# Patient Record
Sex: Female | Born: 1965 | Race: Black or African American | Hispanic: No | Smoking: Never smoker
Health system: Southern US, Community
[De-identification: ages and names within clinical notes are randomized; demographics above are authoritative.]

## PROBLEM LIST (undated history)

## (undated) DIAGNOSIS — N19 Unspecified kidney failure: Secondary | ICD-10-CM

## (undated) DIAGNOSIS — G629 Polyneuropathy, unspecified: Secondary | ICD-10-CM

## (undated) DIAGNOSIS — Z21 Asymptomatic human immunodeficiency virus [HIV] infection status: Secondary | ICD-10-CM

## (undated) DIAGNOSIS — R011 Cardiac murmur, unspecified: Secondary | ICD-10-CM

## (undated) DIAGNOSIS — Z5189 Encounter for other specified aftercare: Secondary | ICD-10-CM

## (undated) DIAGNOSIS — T7840XA Allergy, unspecified, initial encounter: Secondary | ICD-10-CM

## (undated) DIAGNOSIS — B20 Human immunodeficiency virus [HIV] disease: Secondary | ICD-10-CM

## (undated) DIAGNOSIS — N186 End stage renal disease: Secondary | ICD-10-CM

## (undated) DIAGNOSIS — R739 Hyperglycemia, unspecified: Secondary | ICD-10-CM

## (undated) HISTORY — DX: Asymptomatic human immunodeficiency virus (hiv) infection status: Z21

## (undated) HISTORY — DX: Cardiac murmur, unspecified: R01.1

## (undated) HISTORY — DX: End stage renal disease: N18.6

## (undated) HISTORY — DX: Hyperglycemia, unspecified: R73.9

## (undated) HISTORY — DX: Unspecified kidney failure: N19

## (undated) HISTORY — PX: BREAST REDUCTION SURGERY: SHX8

## (undated) HISTORY — PX: REDUCTION MAMMAPLASTY: SUR839

## (undated) HISTORY — DX: Polyneuropathy, unspecified: G62.9

## (undated) HISTORY — DX: Human immunodeficiency virus (HIV) disease: B20

## (undated) HISTORY — PX: HEMORROIDECTOMY: SUR656

## (undated) HISTORY — DX: Allergy, unspecified, initial encounter: T78.40XA

## (undated) HISTORY — DX: Encounter for other specified aftercare: Z51.89

---

## 1991-10-14 HISTORY — PX: TUBAL LIGATION: SHX77

## 1996-10-27 ENCOUNTER — Encounter: Payer: Self-pay | Admitting: Internal Medicine

## 1997-08-04 ENCOUNTER — Encounter: Payer: Self-pay | Admitting: Internal Medicine

## 1998-11-14 ENCOUNTER — Encounter (INDEPENDENT_AMBULATORY_CARE_PROVIDER_SITE_OTHER): Payer: Self-pay | Admitting: *Deleted

## 1998-11-14 LAB — CONVERTED CEMR LAB: CD4 Count: 266 microliters

## 1999-06-06 ENCOUNTER — Ambulatory Visit (HOSPITAL_COMMUNITY): Admission: RE | Admit: 1999-06-06 | Discharge: 1999-06-06 | Payer: Self-pay | Admitting: Urology

## 1999-06-06 ENCOUNTER — Encounter: Payer: Self-pay | Admitting: Urology

## 1999-06-21 ENCOUNTER — Ambulatory Visit (HOSPITAL_BASED_OUTPATIENT_CLINIC_OR_DEPARTMENT_OTHER): Admission: RE | Admit: 1999-06-21 | Discharge: 1999-06-21 | Payer: Self-pay | Admitting: Urology

## 2000-02-19 ENCOUNTER — Encounter: Admission: RE | Admit: 2000-02-19 | Discharge: 2000-02-19 | Payer: Self-pay | Admitting: Infectious Diseases

## 2000-02-19 ENCOUNTER — Ambulatory Visit (HOSPITAL_COMMUNITY): Admission: RE | Admit: 2000-02-19 | Discharge: 2000-02-19 | Payer: Self-pay | Admitting: Infectious Diseases

## 2000-03-16 ENCOUNTER — Encounter: Admission: RE | Admit: 2000-03-16 | Discharge: 2000-03-16 | Payer: Self-pay | Admitting: Infectious Diseases

## 2000-03-23 ENCOUNTER — Encounter: Admission: RE | Admit: 2000-03-23 | Discharge: 2000-03-23 | Payer: Self-pay | Admitting: Infectious Diseases

## 2000-04-02 ENCOUNTER — Encounter: Admission: RE | Admit: 2000-04-02 | Discharge: 2000-04-02 | Payer: Self-pay | Admitting: Internal Medicine

## 2000-06-10 ENCOUNTER — Ambulatory Visit (HOSPITAL_COMMUNITY): Admission: RE | Admit: 2000-06-10 | Discharge: 2000-06-10 | Payer: Self-pay | Admitting: Infectious Diseases

## 2000-06-10 ENCOUNTER — Encounter: Admission: RE | Admit: 2000-06-10 | Discharge: 2000-06-10 | Payer: Self-pay | Admitting: Infectious Diseases

## 2000-07-06 ENCOUNTER — Encounter: Admission: RE | Admit: 2000-07-06 | Discharge: 2000-07-06 | Payer: Self-pay | Admitting: Infectious Diseases

## 2000-12-07 ENCOUNTER — Encounter: Admission: RE | Admit: 2000-12-07 | Discharge: 2000-12-07 | Payer: Self-pay | Admitting: Internal Medicine

## 2000-12-07 ENCOUNTER — Ambulatory Visit (HOSPITAL_COMMUNITY): Admission: RE | Admit: 2000-12-07 | Discharge: 2000-12-07 | Payer: Self-pay | Admitting: Infectious Diseases

## 2001-01-11 ENCOUNTER — Encounter: Payer: Self-pay | Admitting: Internal Medicine

## 2001-01-11 ENCOUNTER — Other Ambulatory Visit: Admission: RE | Admit: 2001-01-11 | Discharge: 2001-01-11 | Payer: Self-pay | Admitting: Gynecology

## 2001-09-06 ENCOUNTER — Encounter: Admission: RE | Admit: 2001-09-06 | Discharge: 2001-09-06 | Payer: Self-pay | Admitting: Internal Medicine

## 2001-09-06 ENCOUNTER — Ambulatory Visit (HOSPITAL_COMMUNITY): Admission: RE | Admit: 2001-09-06 | Discharge: 2001-09-06 | Payer: Self-pay | Admitting: Infectious Diseases

## 2001-09-20 ENCOUNTER — Encounter: Admission: RE | Admit: 2001-09-20 | Discharge: 2001-09-20 | Payer: Self-pay | Admitting: Infectious Diseases

## 2001-12-14 ENCOUNTER — Encounter: Admission: RE | Admit: 2001-12-14 | Discharge: 2001-12-14 | Payer: Self-pay | Admitting: Infectious Diseases

## 2001-12-14 ENCOUNTER — Ambulatory Visit (HOSPITAL_COMMUNITY): Admission: RE | Admit: 2001-12-14 | Discharge: 2001-12-14 | Payer: Self-pay | Admitting: Infectious Diseases

## 2002-01-03 ENCOUNTER — Encounter: Admission: RE | Admit: 2002-01-03 | Discharge: 2002-01-03 | Payer: Self-pay | Admitting: Infectious Diseases

## 2002-02-11 ENCOUNTER — Encounter: Admission: RE | Admit: 2002-02-11 | Discharge: 2002-02-11 | Payer: Self-pay | Admitting: Infectious Diseases

## 2002-03-18 ENCOUNTER — Encounter: Admission: RE | Admit: 2002-03-18 | Discharge: 2002-03-18 | Payer: Self-pay | Admitting: Infectious Diseases

## 2002-04-11 ENCOUNTER — Encounter: Admission: RE | Admit: 2002-04-11 | Discharge: 2002-04-11 | Payer: Self-pay | Admitting: Infectious Diseases

## 2002-04-11 ENCOUNTER — Ambulatory Visit (HOSPITAL_COMMUNITY): Admission: RE | Admit: 2002-04-11 | Discharge: 2002-04-11 | Payer: Self-pay | Admitting: Infectious Diseases

## 2002-04-25 ENCOUNTER — Encounter: Admission: RE | Admit: 2002-04-25 | Discharge: 2002-04-25 | Payer: Self-pay | Admitting: Infectious Diseases

## 2002-07-14 ENCOUNTER — Ambulatory Visit (HOSPITAL_COMMUNITY): Admission: RE | Admit: 2002-07-14 | Discharge: 2002-07-14 | Payer: Self-pay | Admitting: Infectious Diseases

## 2002-07-14 ENCOUNTER — Encounter: Admission: RE | Admit: 2002-07-14 | Discharge: 2002-07-14 | Payer: Self-pay | Admitting: Infectious Diseases

## 2002-07-28 ENCOUNTER — Encounter: Admission: RE | Admit: 2002-07-28 | Discharge: 2002-07-28 | Payer: Self-pay | Admitting: Infectious Diseases

## 2002-09-26 ENCOUNTER — Ambulatory Visit (HOSPITAL_COMMUNITY): Admission: RE | Admit: 2002-09-26 | Discharge: 2002-09-26 | Payer: Self-pay | Admitting: Infectious Diseases

## 2002-09-26 ENCOUNTER — Encounter: Admission: RE | Admit: 2002-09-26 | Discharge: 2002-09-26 | Payer: Self-pay | Admitting: Infectious Diseases

## 2002-10-17 ENCOUNTER — Encounter: Admission: RE | Admit: 2002-10-17 | Discharge: 2002-10-17 | Payer: Self-pay | Admitting: Infectious Diseases

## 2002-12-01 ENCOUNTER — Encounter: Admission: RE | Admit: 2002-12-01 | Discharge: 2002-12-01 | Payer: Self-pay | Admitting: Infectious Diseases

## 2002-12-28 ENCOUNTER — Encounter: Payer: Self-pay | Admitting: Internal Medicine

## 2002-12-28 ENCOUNTER — Other Ambulatory Visit: Admission: RE | Admit: 2002-12-28 | Discharge: 2002-12-28 | Payer: Self-pay | Admitting: Gynecology

## 2003-02-09 ENCOUNTER — Encounter (INDEPENDENT_AMBULATORY_CARE_PROVIDER_SITE_OTHER): Payer: Self-pay | Admitting: Infectious Diseases

## 2003-02-09 ENCOUNTER — Encounter: Admission: RE | Admit: 2003-02-09 | Discharge: 2003-02-09 | Payer: Self-pay | Admitting: Infectious Diseases

## 2003-02-27 ENCOUNTER — Encounter: Admission: RE | Admit: 2003-02-27 | Discharge: 2003-02-27 | Payer: Self-pay | Admitting: Infectious Diseases

## 2003-07-17 ENCOUNTER — Encounter (INDEPENDENT_AMBULATORY_CARE_PROVIDER_SITE_OTHER): Payer: Self-pay | Admitting: Infectious Diseases

## 2003-07-17 ENCOUNTER — Encounter: Admission: RE | Admit: 2003-07-17 | Discharge: 2003-07-17 | Payer: Self-pay | Admitting: Infectious Diseases

## 2003-07-17 ENCOUNTER — Ambulatory Visit (HOSPITAL_COMMUNITY): Admission: RE | Admit: 2003-07-17 | Discharge: 2003-07-17 | Payer: Self-pay | Admitting: Infectious Diseases

## 2003-08-07 ENCOUNTER — Encounter: Admission: RE | Admit: 2003-08-07 | Discharge: 2003-08-07 | Payer: Self-pay | Admitting: Infectious Diseases

## 2003-10-24 ENCOUNTER — Ambulatory Visit (HOSPITAL_COMMUNITY): Admission: RE | Admit: 2003-10-24 | Discharge: 2003-10-24 | Payer: Self-pay | Admitting: Infectious Diseases

## 2003-10-24 ENCOUNTER — Encounter: Admission: RE | Admit: 2003-10-24 | Discharge: 2003-10-24 | Payer: Self-pay | Admitting: Infectious Diseases

## 2003-11-20 ENCOUNTER — Encounter: Admission: RE | Admit: 2003-11-20 | Discharge: 2003-11-20 | Payer: Self-pay | Admitting: Infectious Diseases

## 2003-12-04 ENCOUNTER — Encounter: Admission: RE | Admit: 2003-12-04 | Discharge: 2003-12-04 | Payer: Self-pay | Admitting: Infectious Diseases

## 2004-01-03 ENCOUNTER — Inpatient Hospital Stay (HOSPITAL_COMMUNITY): Admission: AD | Admit: 2004-01-03 | Discharge: 2004-01-05 | Payer: Self-pay | Admitting: Nephrology

## 2004-01-03 ENCOUNTER — Emergency Department (HOSPITAL_COMMUNITY): Admission: EM | Admit: 2004-01-03 | Discharge: 2004-01-03 | Payer: Self-pay | Admitting: Emergency Medicine

## 2004-03-08 ENCOUNTER — Ambulatory Visit (HOSPITAL_COMMUNITY): Admission: RE | Admit: 2004-03-08 | Discharge: 2004-03-08 | Payer: Self-pay | Admitting: Vascular Surgery

## 2004-04-10 ENCOUNTER — Ambulatory Visit (HOSPITAL_COMMUNITY): Admission: RE | Admit: 2004-04-10 | Discharge: 2004-04-10 | Payer: Self-pay | Admitting: Nephrology

## 2004-06-18 ENCOUNTER — Ambulatory Visit: Payer: Self-pay | Admitting: Infectious Diseases

## 2004-06-18 ENCOUNTER — Ambulatory Visit (HOSPITAL_COMMUNITY): Admission: RE | Admit: 2004-06-18 | Discharge: 2004-06-18 | Payer: Self-pay | Admitting: Infectious Diseases

## 2004-07-01 ENCOUNTER — Ambulatory Visit: Payer: Self-pay | Admitting: Infectious Diseases

## 2004-08-09 ENCOUNTER — Encounter: Admission: RE | Admit: 2004-08-09 | Discharge: 2004-08-09 | Payer: Self-pay | Admitting: Nephrology

## 2004-09-23 ENCOUNTER — Ambulatory Visit: Payer: Self-pay | Admitting: Internal Medicine

## 2004-09-23 ENCOUNTER — Ambulatory Visit (HOSPITAL_COMMUNITY): Admission: RE | Admit: 2004-09-23 | Discharge: 2004-09-23 | Payer: Self-pay | Admitting: Infectious Diseases

## 2004-10-04 ENCOUNTER — Ambulatory Visit: Payer: Self-pay | Admitting: Infectious Diseases

## 2005-01-15 ENCOUNTER — Other Ambulatory Visit: Admission: RE | Admit: 2005-01-15 | Discharge: 2005-01-15 | Payer: Self-pay | Admitting: Gynecology

## 2005-01-15 ENCOUNTER — Encounter: Payer: Self-pay | Admitting: Internal Medicine

## 2005-03-18 ENCOUNTER — Ambulatory Visit (HOSPITAL_COMMUNITY): Admission: RE | Admit: 2005-03-18 | Discharge: 2005-03-18 | Payer: Self-pay | Admitting: Infectious Diseases

## 2005-03-18 ENCOUNTER — Ambulatory Visit: Payer: Self-pay | Admitting: Infectious Diseases

## 2005-04-08 ENCOUNTER — Ambulatory Visit: Payer: Self-pay | Admitting: Infectious Diseases

## 2005-07-23 ENCOUNTER — Ambulatory Visit (HOSPITAL_COMMUNITY): Admission: RE | Admit: 2005-07-23 | Discharge: 2005-07-23 | Payer: Self-pay | Admitting: Infectious Diseases

## 2005-07-23 ENCOUNTER — Ambulatory Visit: Payer: Self-pay | Admitting: Infectious Diseases

## 2005-08-05 ENCOUNTER — Ambulatory Visit: Payer: Self-pay | Admitting: Infectious Diseases

## 2005-11-25 ENCOUNTER — Ambulatory Visit: Payer: Self-pay | Admitting: Infectious Diseases

## 2005-12-11 ENCOUNTER — Ambulatory Visit: Payer: Self-pay | Admitting: Infectious Diseases

## 2005-12-12 ENCOUNTER — Ambulatory Visit: Payer: Self-pay | Admitting: Infectious Diseases

## 2006-02-06 ENCOUNTER — Encounter: Admission: RE | Admit: 2006-02-06 | Discharge: 2006-02-06 | Payer: Self-pay | Admitting: Nephrology

## 2006-02-09 ENCOUNTER — Ambulatory Visit: Payer: Self-pay | Admitting: Infectious Diseases

## 2006-02-09 ENCOUNTER — Encounter: Admission: RE | Admit: 2006-02-09 | Discharge: 2006-02-09 | Payer: Self-pay | Admitting: Infectious Diseases

## 2006-02-20 ENCOUNTER — Encounter: Admission: RE | Admit: 2006-02-20 | Discharge: 2006-02-20 | Payer: Self-pay | Admitting: Nephrology

## 2006-02-25 ENCOUNTER — Ambulatory Visit: Payer: Self-pay | Admitting: Infectious Diseases

## 2006-03-06 ENCOUNTER — Inpatient Hospital Stay (HOSPITAL_COMMUNITY): Admission: AD | Admit: 2006-03-06 | Discharge: 2006-03-10 | Payer: Self-pay | Admitting: Nephrology

## 2006-03-07 ENCOUNTER — Ambulatory Visit: Payer: Self-pay | Admitting: Infectious Diseases

## 2006-05-28 ENCOUNTER — Encounter: Payer: Self-pay | Admitting: Internal Medicine

## 2006-05-28 ENCOUNTER — Other Ambulatory Visit: Admission: RE | Admit: 2006-05-28 | Discharge: 2006-05-28 | Payer: Self-pay | Admitting: Gynecology

## 2006-05-28 ENCOUNTER — Encounter: Payer: Self-pay | Admitting: Infectious Disease

## 2006-05-28 ENCOUNTER — Encounter (INDEPENDENT_AMBULATORY_CARE_PROVIDER_SITE_OTHER): Payer: Self-pay | Admitting: *Deleted

## 2006-06-16 ENCOUNTER — Encounter: Admission: RE | Admit: 2006-06-16 | Discharge: 2006-06-16 | Payer: Self-pay | Admitting: Infectious Diseases

## 2006-06-16 ENCOUNTER — Ambulatory Visit: Payer: Self-pay | Admitting: Infectious Diseases

## 2006-06-16 ENCOUNTER — Encounter (INDEPENDENT_AMBULATORY_CARE_PROVIDER_SITE_OTHER): Payer: Self-pay | Admitting: *Deleted

## 2006-07-01 ENCOUNTER — Ambulatory Visit: Payer: Self-pay | Admitting: Infectious Diseases

## 2006-07-21 DIAGNOSIS — B20 Human immunodeficiency virus [HIV] disease: Secondary | ICD-10-CM | POA: Insufficient documentation

## 2006-07-21 DIAGNOSIS — Z21 Asymptomatic human immunodeficiency virus [HIV] infection status: Secondary | ICD-10-CM | POA: Insufficient documentation

## 2006-07-31 DIAGNOSIS — N19 Unspecified kidney failure: Secondary | ICD-10-CM | POA: Insufficient documentation

## 2006-09-14 ENCOUNTER — Encounter (INDEPENDENT_AMBULATORY_CARE_PROVIDER_SITE_OTHER): Payer: Self-pay | Admitting: *Deleted

## 2006-09-14 ENCOUNTER — Ambulatory Visit: Payer: Self-pay | Admitting: Infectious Diseases

## 2006-09-14 ENCOUNTER — Encounter: Admission: RE | Admit: 2006-09-14 | Discharge: 2006-09-14 | Payer: Self-pay | Admitting: Infectious Diseases

## 2006-09-14 LAB — CONVERTED CEMR LAB
ALT: 10 units/L (ref 0–35)
AST: 14 units/L (ref 0–37)
Albumin: 4.3 g/dL (ref 3.5–5.2)
Basophils Absolute: 0 10*3/uL (ref 0.0–0.1)
Basophils Relative: 1 % (ref 0–1)
Calcium: 8.9 mg/dL (ref 8.4–10.5)
Chloride: 95 meq/L — ABNORMAL LOW (ref 96–112)
HIV 1 RNA Quant: 105 copies/mL
Lymphs Abs: 1.1 10*3/uL (ref 0.8–3.1)
MCHC: 30.9 g/dL — ABNORMAL LOW (ref 33.1–35.4)
MCV: 106.6 fL — ABNORMAL HIGH (ref 78.8–100.0)
Neutrophils Relative %: 65 % (ref 47–77)
Platelets: 175 10*3/uL (ref 152–374)
Potassium: 4.6 meq/L (ref 3.5–5.3)
RDW: 15 % (ref 11.5–15.3)
Sodium: 140 meq/L (ref 135–145)
Total Protein: 8.1 g/dL (ref 6.0–8.3)
WBC: 6.4 10*3/uL (ref 3.7–10.0)

## 2006-09-28 ENCOUNTER — Ambulatory Visit: Payer: Self-pay | Admitting: Infectious Diseases

## 2006-11-16 ENCOUNTER — Ambulatory Visit: Payer: Self-pay | Admitting: Infectious Diseases

## 2006-11-16 ENCOUNTER — Encounter: Admission: RE | Admit: 2006-11-16 | Discharge: 2006-11-16 | Payer: Self-pay | Admitting: Infectious Diseases

## 2006-11-16 ENCOUNTER — Encounter (INDEPENDENT_AMBULATORY_CARE_PROVIDER_SITE_OTHER): Payer: Self-pay | Admitting: *Deleted

## 2006-11-16 LAB — CONVERTED CEMR LAB
AST: 15 units/L (ref 0–37)
Albumin: 4.4 g/dL (ref 3.5–5.2)
Alkaline Phosphatase: 91 units/L (ref 39–117)
BUN: 17 mg/dL (ref 6–23)
Basophils Relative: 1 % (ref 0–1)
CD4 Count: 140 microliters
Eosinophils Absolute: 0.6 10*3/uL (ref 0.0–0.7)
Eosinophils Relative: 10 % — ABNORMAL HIGH (ref 0–5)
HCT: 43.5 % (ref 36.0–46.0)
HDL: 37 mg/dL — ABNORMAL LOW (ref >39)
HIV 1 RNA Quant: 76 copies/mL
LDL Cholesterol: 87 mg/dL (ref 0–99)
MCHC: 30.3 g/dL (ref 30.0–36.0)
MCV: 99.3 fL (ref 78.0–100.0)
Neutrophils Relative %: 59 % (ref 43–77)
Platelets: 159 10*3/uL (ref 150–400)
Potassium: 4.3 meq/L (ref 3.5–5.3)
Sodium: 142 meq/L (ref 135–145)
Total Bilirubin: 0.5 mg/dL (ref 0.3–1.2)
Total Protein: 8.3 g/dL (ref 6.0–8.3)
Triglycerides: 200 mg/dL — ABNORMAL HIGH (ref <150)
VLDL: 40 mg/dL (ref 0–40)

## 2006-12-07 ENCOUNTER — Encounter (INDEPENDENT_AMBULATORY_CARE_PROVIDER_SITE_OTHER): Payer: Self-pay | Admitting: *Deleted

## 2006-12-07 LAB — CONVERTED CEMR LAB: Pap Smear: NORMAL

## 2006-12-20 ENCOUNTER — Encounter (INDEPENDENT_AMBULATORY_CARE_PROVIDER_SITE_OTHER): Payer: Self-pay | Admitting: *Deleted

## 2006-12-20 ENCOUNTER — Encounter: Admission: RE | Admit: 2006-12-20 | Discharge: 2006-12-20 | Payer: Self-pay | Admitting: Nephrology

## 2006-12-28 ENCOUNTER — Ambulatory Visit: Payer: Self-pay | Admitting: Infectious Diseases

## 2006-12-28 DIAGNOSIS — G5790 Unspecified mononeuropathy of unspecified lower limb: Secondary | ICD-10-CM | POA: Insufficient documentation

## 2007-02-18 ENCOUNTER — Ambulatory Visit: Payer: Self-pay | Admitting: Infectious Diseases

## 2007-02-18 ENCOUNTER — Encounter: Admission: RE | Admit: 2007-02-18 | Discharge: 2007-02-18 | Payer: Self-pay | Admitting: Infectious Diseases

## 2007-03-03 ENCOUNTER — Ambulatory Visit: Payer: Self-pay | Admitting: Infectious Diseases

## 2007-04-22 ENCOUNTER — Encounter (INDEPENDENT_AMBULATORY_CARE_PROVIDER_SITE_OTHER): Payer: Self-pay | Admitting: *Deleted

## 2007-04-23 ENCOUNTER — Ambulatory Visit: Payer: Self-pay | Admitting: Internal Medicine

## 2007-04-30 ENCOUNTER — Telehealth: Payer: Self-pay | Admitting: Internal Medicine

## 2007-05-04 ENCOUNTER — Encounter (INDEPENDENT_AMBULATORY_CARE_PROVIDER_SITE_OTHER): Payer: Self-pay | Admitting: *Deleted

## 2007-05-10 ENCOUNTER — Encounter: Payer: Self-pay | Admitting: Infectious Disease

## 2007-05-12 ENCOUNTER — Telehealth: Payer: Self-pay | Admitting: Infectious Disease

## 2007-06-23 ENCOUNTER — Telehealth: Payer: Self-pay | Admitting: Internal Medicine

## 2007-08-24 ENCOUNTER — Encounter (INDEPENDENT_AMBULATORY_CARE_PROVIDER_SITE_OTHER): Payer: Self-pay | Admitting: *Deleted

## 2007-08-24 ENCOUNTER — Ambulatory Visit: Payer: Self-pay | Admitting: Internal Medicine

## 2007-08-24 ENCOUNTER — Encounter: Admission: RE | Admit: 2007-08-24 | Discharge: 2007-08-24 | Payer: Self-pay | Admitting: Internal Medicine

## 2007-08-24 ENCOUNTER — Other Ambulatory Visit: Admission: RE | Admit: 2007-08-24 | Discharge: 2007-08-24 | Payer: Self-pay | Admitting: Gynecology

## 2007-08-24 LAB — CONVERTED CEMR LAB: Pap Smear: NORMAL

## 2007-09-07 ENCOUNTER — Ambulatory Visit: Payer: Self-pay | Admitting: Internal Medicine

## 2007-11-09 ENCOUNTER — Encounter: Payer: Self-pay | Admitting: Internal Medicine

## 2007-11-17 ENCOUNTER — Encounter: Admission: RE | Admit: 2007-11-17 | Discharge: 2007-11-17 | Payer: Self-pay | Admitting: Internal Medicine

## 2007-11-17 ENCOUNTER — Ambulatory Visit: Payer: Self-pay | Admitting: Internal Medicine

## 2007-11-17 LAB — CONVERTED CEMR LAB
ALT: 15 units/L (ref 0–35)
AST: 20 units/L (ref 0–37)
Basophils Absolute: 0 10*3/uL (ref 0.0–0.1)
Basophils Relative: 1 % (ref 0–1)
Calcium: 10.1 mg/dL (ref 8.4–10.5)
Chloride: 108 meq/L (ref 96–112)
Creatinine, Ser: 1.58 mg/dL — ABNORMAL HIGH (ref 0.40–1.20)
MCHC: 32.4 g/dL (ref 30.0–36.0)
Monocytes Absolute: 0.6 10*3/uL (ref 0.1–1.0)
Neutro Abs: 2.4 10*3/uL (ref 1.7–7.7)
Neutrophils Relative %: 54 % (ref 43–77)
Platelets: 183 10*3/uL (ref 150–400)
Potassium: 4.2 meq/L (ref 3.5–5.3)
RDW: 13.2 % (ref 11.5–15.5)
Sodium: 140 meq/L (ref 135–145)
Total Protein: 7.3 g/dL (ref 6.0–8.3)

## 2007-11-18 ENCOUNTER — Telehealth: Payer: Self-pay | Admitting: Internal Medicine

## 2007-12-07 ENCOUNTER — Encounter (INDEPENDENT_AMBULATORY_CARE_PROVIDER_SITE_OTHER): Payer: Self-pay | Admitting: *Deleted

## 2007-12-08 ENCOUNTER — Ambulatory Visit: Payer: Self-pay | Admitting: Internal Medicine

## 2007-12-08 DIAGNOSIS — Z940 Kidney transplant status: Secondary | ICD-10-CM | POA: Insufficient documentation

## 2007-12-14 ENCOUNTER — Encounter: Payer: Self-pay | Admitting: Internal Medicine

## 2007-12-21 ENCOUNTER — Telehealth: Payer: Self-pay | Admitting: Internal Medicine

## 2008-01-14 ENCOUNTER — Encounter: Payer: Self-pay | Admitting: Internal Medicine

## 2008-03-13 ENCOUNTER — Encounter: Payer: Self-pay | Admitting: Internal Medicine

## 2008-03-14 ENCOUNTER — Encounter: Admission: RE | Admit: 2008-03-14 | Discharge: 2008-03-14 | Payer: Self-pay | Admitting: Internal Medicine

## 2008-03-14 ENCOUNTER — Ambulatory Visit: Payer: Self-pay | Admitting: Internal Medicine

## 2008-03-14 LAB — CONVERTED CEMR LAB
BUN: 19 mg/dL (ref 6–23)
Basophils Relative: 0 % (ref 0–1)
CO2: 19 meq/L (ref 19–32)
Calcium: 10.1 mg/dL (ref 8.4–10.5)
Chloride: 111 meq/L (ref 96–112)
Creatinine, Ser: 1.67 mg/dL — ABNORMAL HIGH (ref 0.40–1.20)
HIV 1 RNA Quant: <50 copies/mL (ref <50)
HIV-1 RNA Quant, Log: <1.7 (ref <1.70)
Hemoglobin: 11.3 g/dL — ABNORMAL LOW (ref 12.0–15.0)
Lymphocytes Relative: 16 % (ref 12–46)
MCHC: 32.5 g/dL (ref 30.0–36.0)
Monocytes Absolute: 0.5 10*3/uL (ref 0.1–1.0)
Monocytes Relative: 11 % (ref 3–12)
Neutro Abs: 3.3 10*3/uL (ref 1.7–7.7)
RBC: 3.91 M/uL (ref 3.87–5.11)

## 2008-03-22 ENCOUNTER — Ambulatory Visit: Payer: Self-pay | Admitting: Internal Medicine

## 2008-03-22 DIAGNOSIS — R7309 Other abnormal glucose: Secondary | ICD-10-CM | POA: Insufficient documentation

## 2008-10-11 ENCOUNTER — Other Ambulatory Visit: Admission: RE | Admit: 2008-10-11 | Discharge: 2008-10-11 | Payer: Self-pay | Admitting: Gynecology

## 2008-10-13 HISTORY — PX: KIDNEY TRANSPLANT: SHX239

## 2009-11-22 ENCOUNTER — Ambulatory Visit: Payer: Self-pay | Admitting: Vascular Surgery

## 2009-12-04 ENCOUNTER — Ambulatory Visit (HOSPITAL_COMMUNITY)
Admission: RE | Admit: 2009-12-04 | Discharge: 2009-12-04 | Payer: Self-pay | Source: Home / Self Care | Admitting: Vascular Surgery

## 2009-12-04 ENCOUNTER — Ambulatory Visit: Payer: Self-pay | Admitting: Vascular Surgery

## 2009-12-04 HISTORY — PX: OTHER SURGICAL HISTORY: SHX169

## 2010-01-17 ENCOUNTER — Ambulatory Visit: Payer: Self-pay | Admitting: Vascular Surgery

## 2010-09-02 ENCOUNTER — Encounter (INDEPENDENT_AMBULATORY_CARE_PROVIDER_SITE_OTHER): Payer: Self-pay | Admitting: *Deleted

## 2010-11-10 LAB — CONVERTED CEMR LAB
ALT: 10 units/L (ref 0–35)
AST: 11 units/L (ref 0–37)
Albumin: 4 g/dL (ref 3.5–5.2)
Albumin: 4.4 g/dL (ref 3.5–5.2)
Alkaline Phosphatase: 74 units/L (ref 39–117)
BUN: 38 mg/dL — ABNORMAL HIGH (ref 6–23)
Basophils Absolute: 0.1 10*3/uL (ref 0.0–0.1)
Basophils Relative: 1 % (ref 0–1)
CO2: 18 meq/L — ABNORMAL LOW (ref 19–32)
Calcium: 9.9 mg/dL (ref 8.4–10.5)
Chloride: 110 meq/L (ref 96–112)
Creatinine, Ser: 10.56 mg/dL — ABNORMAL HIGH (ref 0.40–1.20)
Eosinophils Absolute: 0.6 10*3/uL (ref 0.0–0.7)
Eosinophils Relative: 13 % — ABNORMAL HIGH (ref 0–5)
Glucose, Bld: 110 mg/dL — ABNORMAL HIGH (ref 70–99)
Glucose, Bld: 119 mg/dL — ABNORMAL HIGH (ref 70–99)
HCT: 42.3 % (ref 36.0–46.0)
HIV 1 RNA Quant: 207 copies/mL — ABNORMAL HIGH (ref <50)
HIV 1 RNA Quant: <50 copies/mL (ref <50)
HIV-1 RNA Quant, Log: <1.7 (ref <1.70)
Hemoglobin: 10.5 g/dL — ABNORMAL LOW (ref 12.0–15.0)
Hemoglobin: 12.8 g/dL (ref 12.0–15.0)
Lymphocytes Relative: 20 % (ref 12–46)
Lymphs Abs: 1.6 10*3/uL (ref 0.7–3.3)
Lymphs Abs: 2 10*3/uL (ref 0.7–4.0)
MCHC: 30.3 g/dL (ref 30.0–36.0)
MCV: 90.4 fL (ref 78.0–100.0)
Monocytes Absolute: 0.5 10*3/uL (ref 0.2–0.7)
Monocytes Absolute: 0.8 10*3/uL (ref 0.1–1.0)
Monocytes Relative: 9 % (ref 3–11)
Neutro Abs: 7.3 10*3/uL (ref 1.7–7.7)
Neutrophils Relative %: 43 % (ref 43–77)
Platelets: 203 10*3/uL (ref 150–400)
Potassium: 4.5 meq/L (ref 3.5–5.3)
RBC: 4.68 M/uL (ref 3.87–5.11)
RDW: 17.9 % — ABNORMAL HIGH (ref 11.5–15.5)
Sodium: 140 meq/L (ref 135–145)
Total Protein: 7.1 g/dL (ref 6.0–8.3)
WBC: 10.3 10*3/uL (ref 4.0–10.5)
WBC: 4.8 10*3/uL (ref 4.0–10.5)

## 2010-11-12 ENCOUNTER — Encounter (HOSPITAL_COMMUNITY)
Admission: RE | Admit: 2010-11-12 | Discharge: 2010-11-12 | Payer: Self-pay | Source: Home / Self Care | Attending: Nephrology | Admitting: Nephrology

## 2010-11-12 LAB — POCT HEMOGLOBIN-HEMACUE: Hemoglobin: 12.5 g/dL (ref 12.0–15.0)

## 2010-11-14 NOTE — Miscellaneous (Signed)
  Clinical Lists Changes  Observations: Added new observation of YEARAIDSPOS: 1998  (09/02/2010 14:33) Added new observation of HIV STATUS: CDC-defined AIDS  (09/02/2010 14:33)

## 2010-11-26 ENCOUNTER — Other Ambulatory Visit: Payer: Self-pay

## 2010-11-26 ENCOUNTER — Encounter (HOSPITAL_COMMUNITY): Payer: Private Health Insurance - Indemnity | Attending: Nephrology

## 2010-11-26 DIAGNOSIS — N2581 Secondary hyperparathyroidism of renal origin: Secondary | ICD-10-CM | POA: Insufficient documentation

## 2010-11-26 DIAGNOSIS — D638 Anemia in other chronic diseases classified elsewhere: Secondary | ICD-10-CM | POA: Insufficient documentation

## 2010-11-26 DIAGNOSIS — Z940 Kidney transplant status: Secondary | ICD-10-CM | POA: Insufficient documentation

## 2010-11-26 DIAGNOSIS — N184 Chronic kidney disease, stage 4 (severe): Secondary | ICD-10-CM | POA: Insufficient documentation

## 2010-12-09 ENCOUNTER — Encounter (HOSPITAL_COMMUNITY): Payer: Private Health Insurance - Indemnity

## 2010-12-10 ENCOUNTER — Encounter (HOSPITAL_COMMUNITY): Payer: Private Health Insurance - Indemnity

## 2010-12-16 ENCOUNTER — Encounter (HOSPITAL_COMMUNITY): Payer: Private Health Insurance - Indemnity | Attending: Nephrology

## 2010-12-16 ENCOUNTER — Other Ambulatory Visit: Payer: Self-pay | Admitting: Nephrology

## 2010-12-16 DIAGNOSIS — D638 Anemia in other chronic diseases classified elsewhere: Secondary | ICD-10-CM | POA: Insufficient documentation

## 2010-12-16 DIAGNOSIS — Z940 Kidney transplant status: Secondary | ICD-10-CM | POA: Insufficient documentation

## 2010-12-16 DIAGNOSIS — N184 Chronic kidney disease, stage 4 (severe): Secondary | ICD-10-CM | POA: Insufficient documentation

## 2010-12-16 DIAGNOSIS — N2581 Secondary hyperparathyroidism of renal origin: Secondary | ICD-10-CM | POA: Insufficient documentation

## 2010-12-16 DIAGNOSIS — E785 Hyperlipidemia, unspecified: Secondary | ICD-10-CM | POA: Insufficient documentation

## 2010-12-16 LAB — CBC
MCH: 32.3 pg (ref 26.0–34.0)
Platelets: 148 10*3/uL — ABNORMAL LOW (ref 150–400)
RBC: 3.71 MIL/uL — ABNORMAL LOW (ref 3.87–5.11)
RDW: 15 % (ref 11.5–15.5)

## 2010-12-16 LAB — PROTEIN / CREATININE RATIO, URINE
Creatinine, Urine: 142.6 mg/dL
Protein Creatinine Ratio: 0.14 (ref 0.00–0.15)
Total Protein, Urine: 20 mg/dL

## 2010-12-16 LAB — URINALYSIS, ROUTINE W REFLEX MICROSCOPIC
Ketones, ur: NEGATIVE mg/dL
Nitrite: NEGATIVE
Protein, ur: NEGATIVE mg/dL
Urobilinogen, UA: 0.2 mg/dL (ref 0.0–1.0)
pH: 6 (ref 5.0–8.0)

## 2010-12-16 LAB — ELECTROLYTE PANEL: Chloride: 113 mEq/L — ABNORMAL HIGH (ref 96–112)

## 2010-12-20 LAB — MISCELLANEOUS TEST

## 2010-12-24 LAB — MISCELLANEOUS TEST

## 2010-12-30 ENCOUNTER — Encounter (HOSPITAL_COMMUNITY): Payer: Private Health Insurance - Indemnity

## 2011-01-01 LAB — POCT I-STAT 4, (NA,K, GLUC, HGB,HCT)
Potassium: 4.4 mEq/L (ref 3.5–5.1)
Sodium: 142 mEq/L (ref 135–145)

## 2011-01-06 ENCOUNTER — Other Ambulatory Visit: Payer: Self-pay | Admitting: Nephrology

## 2011-01-06 ENCOUNTER — Encounter (HOSPITAL_COMMUNITY): Payer: Private Health Insurance - Indemnity

## 2011-01-06 LAB — COMPREHENSIVE METABOLIC PANEL
AST: 15 U/L (ref 0–37)
Alkaline Phosphatase: 97 U/L (ref 39–117)
BUN: 55 mg/dL — ABNORMAL HIGH (ref 6–23)
CO2: 13 mEq/L — ABNORMAL LOW (ref 19–32)
Chloride: 114 mEq/L — ABNORMAL HIGH (ref 96–112)
Creatinine, Ser: 4.76 mg/dL — ABNORMAL HIGH (ref 0.4–1.2)
GFR calc Af Amer: 12 mL/min — ABNORMAL LOW (ref >60)
GFR calc non Af Amer: 10 mL/min — ABNORMAL LOW (ref >60)
Total Bilirubin: 0.4 mg/dL (ref 0.3–1.2)

## 2011-01-06 LAB — IRON AND TIBC: TIBC: 234 ug/dL — ABNORMAL LOW (ref 250–470)

## 2011-01-20 ENCOUNTER — Encounter (HOSPITAL_COMMUNITY): Payer: Private Health Insurance - Indemnity

## 2011-02-21 ENCOUNTER — Other Ambulatory Visit: Payer: Self-pay | Admitting: Nephrology

## 2011-02-21 ENCOUNTER — Encounter (HOSPITAL_COMMUNITY): Payer: Private Health Insurance - Indemnity | Attending: Nephrology

## 2011-02-21 DIAGNOSIS — D638 Anemia in other chronic diseases classified elsewhere: Secondary | ICD-10-CM | POA: Insufficient documentation

## 2011-02-21 DIAGNOSIS — E785 Hyperlipidemia, unspecified: Secondary | ICD-10-CM | POA: Insufficient documentation

## 2011-02-21 DIAGNOSIS — N2581 Secondary hyperparathyroidism of renal origin: Secondary | ICD-10-CM | POA: Insufficient documentation

## 2011-02-21 DIAGNOSIS — Z940 Kidney transplant status: Secondary | ICD-10-CM | POA: Insufficient documentation

## 2011-02-21 DIAGNOSIS — N184 Chronic kidney disease, stage 4 (severe): Secondary | ICD-10-CM | POA: Insufficient documentation

## 2011-02-21 LAB — COMPREHENSIVE METABOLIC PANEL
ALT: 11 U/L (ref 0–35)
AST: 16 U/L (ref 0–37)
Calcium: 9.9 mg/dL (ref 8.4–10.5)
GFR calc Af Amer: 13 mL/min — ABNORMAL LOW (ref >60)
Potassium: 3.9 mEq/L (ref 3.5–5.1)
Sodium: 141 mEq/L (ref 135–145)
Total Protein: 7.5 g/dL (ref 6.0–8.3)

## 2011-02-21 LAB — URINALYSIS, ROUTINE W REFLEX MICROSCOPIC
Bilirubin Urine: NEGATIVE
Glucose, UA: NEGATIVE mg/dL
Ketones, ur: NEGATIVE mg/dL
Protein, ur: NEGATIVE mg/dL
pH: 6 (ref 5.0–8.0)

## 2011-02-21 LAB — PROTEIN / CREATININE RATIO, URINE: Protein Creatinine Ratio: 0.1 (ref 0.00–0.15)

## 2011-02-21 LAB — CBC
Hemoglobin: 10.2 g/dL — ABNORMAL LOW (ref 12.0–15.0)
MCHC: 32.3 g/dL (ref 30.0–36.0)
RDW: 14.9 % (ref 11.5–15.5)
WBC: 8.3 10*3/uL (ref 4.0–10.5)

## 2011-02-21 LAB — IRON AND TIBC
Iron: 87 ug/dL (ref 42–135)
Saturation Ratios: 35 % (ref 20–55)
TIBC: 247 ug/dL — ABNORMAL LOW (ref 250–470)
UIBC: 160 ug/dL

## 2011-02-25 LAB — TACROLIMUS LEVEL: Tacrolimus (FK506) - LabCorp: 4.6 ng/mL

## 2011-02-25 LAB — MISCELLANEOUS TEST

## 2011-02-25 NOTE — Procedures (Signed)
CEPHALIC VEIN MAPPING   INDICATION:  Preop evaluation for AV fistula placement.   HISTORY:  End-stage renal disease, history of left upper arm AVG.   EXAM:  The right cephalic vein is compressible with diameter measurements  ranging from 0.13 to 0.35 cm.   The right basilic vein is compressible with diameter measurements  ranging from 0.26 to 0.41 cm.   The left cephalic vein is compressible with diameter measurements  ranging from 0.21 to 0.29 cm.   The left basilic vein is compressible with diameter measurements ranging  from 0.36 to 0.37 cm.   See attached worksheet for all measurements.   IMPRESSION:  1. Patent bilateral cephalic and basilic veins with diameter      measurements as described above and on the attached worksheet.  2. Totally occluded left upper arm arteriovenous graft noted.   ___________________________________________  Judeth Cornfield. Scot Dock, M.D.   CH/MEDQ  D:  11/23/2009  T:  11/24/2009  Job:  [id number]

## 2011-02-25 NOTE — Assessment & Plan Note (Signed)
OFFICE VISIT   Krista Bean, Krista Bean  DOB:  Aug 04, 1966                                       01/17/2010  [id number]   The date of surgery is 12/04/2009.  The surgery was a right basilic vein  transposition.  The patient returns to clinic today in followup for her  basilic vein transposition which was done in late February.  She states  that she has been doing very well.  Her arm is sometimes a little sore.  Her blood pressure and hypercholesterolemia are under good control.   PHYSICAL FINDINGS:  General:  Revealed a well-nourished woman who  appears her stated age, in no distress.  Vital signs:  Heart rate was  67, blood pressure 140/78, O2 saturation was 100%.  HEENT:  PERRLA,  EOMI.  Lungs:  Were clear.  Cardiac:  Revealed a regular rate and  rhythm.  Abdomen:  Soft, nontender.  Her basilic transposition was  evaluated.  The wounds were healing very, very nicely.  She has a very  good thrill in her basilic transposition.  The fistula by my exam  appears to be working well and should be ready for use should she need  it.  We do recommend not using the fistula until 12 weeks postop.  We  will leave final determination of maturation to Dr. Lorrene Reid.  We will  follow her on a p.r.n. basis.   Chad Cordial, PA   Judeth Cornfield. Scot Dock, M.D.  Electronically Signed   KEL/MEDQ  D:  01/17/2010  T:  01/17/2010  Job:  [id number]   cc:   Elzie Rings. Lorrene Reid, M.D.

## 2011-02-25 NOTE — Assessment & Plan Note (Signed)
OFFICE VISIT   ALTON, FALLETTA  DOB:  [birth year]                                       11/22/2009  [id number]   I saw the patient in the office today for evaluation for hemodialysis  access.  This is a pleasant 45 year old woman who underwent a renal  transplant in 2008, but recently her right kidney function has been  declining and we were asked to evaluate her for access.  Her most recent  attempt at access was a left upper arm graft.  At that time this was in  May of 2005.  At that time she was noted to have a very small artery at  the antecubital level with high bifurcation of the brachial artery.  Therefore upper arm loop graft was placed.   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  She has had no recent  uremic symptoms.  She has had no nausea, vomiting or significant  shortness of breath.   PAST MEDICAL HISTORY:  Is otherwise significant for hypertension which  has been stable on her current medications.  She also has a history of  secondary hyperparathyroidism, which also has been stable.   Past medical history is otherwise fairly unremarkable.  She denies any  history of diabetes, history of previous myocardial infarction, history  of congestive heart failure or history of COPD.   FAMILY HISTORY:  No history of premature cardiovascular disease.   SOCIAL HISTORY:  She is married.  She has two children.  She does not  use tobacco.   REVIEW OF SYSTEMS:  CARDIOVASCULAR:  She has had no recent chest pain,  chest pressure, palpitations or arrhythmias.  She does admit to some  dyspnea on exertion.  She has had no claudication, rest pain or  nonhealing ulcers.  She has had no history of stroke, TIAs.  She has had  no history of DVT.  PULMONARY:  She has had no productive cough, bronchitis, asthma or  wheezing.   PHYSICAL EXAMINATION:  This is a pleasant 45 year old woman who appears  stated age.  Blood pressure is 111/72, heart rate  is 65, temperature is  97.6.   I did independently interpret her vein mapping today which shows that  her upper arm cephalic vein on the left is fairly small and this was  explored at the time of her previous surgery and it was noted to be  sclerotic.  The basilic vein on the left empties into the deep system in  the mid upper arm.  Therefore she is not a candidate for basilic vein  transposition on the left.  In addition, she had a very small artery on  the left which would not be good at supporting the fistula.  On the  right side her forearm and upper arm cephalic vein is very small and she  does not appear to be a candidate for a radiocephalic or brachial  cephalic fistula.  The basilic vein on the left is moderate size and she  may potentially be a candidate for a basilic vein transposition on the  right side.   I have recommended we explore the basilic vein on the right side and if  this is adequate do a basilic vein transposition on the right.  If this  is not adequate we would place a right arm AV graft.  She is somewhat  hesitant to proceed with surgery, so she did not want to schedule this  until December 04, 2009.  We have reviewed the procedure and potential  complications in the office today.  She is agreeable to proceed at that  time.     Judeth Cornfield. Scot Dock, M.D.  Electronically Signed   CSD/MEDQ  D:  11/22/2009  T:  11/23/2009  Job:  [id number]   cc:   Caren Griffins B. Lorrene Reid, M.D.

## 2011-02-28 NOTE — Discharge Summary (Signed)
Krista Bean, Krista Bean                 ACCOUNT NO.:  [id number]   MEDICAL RECORD NO.:  [id number]           PATIENT TYPE:   LOCATION:                               FACILITY:  [facility]   PHYSICIAN:  Roney Jaffe, M.D.   DATE OF BIRTH:  03-13-1966   DATE OF ADMISSION:  03/06/2006  DATE OF DISCHARGE:  03/10/2005                                 DISCHARGE SUMMARY   DISCHARGE DIAGNOSES:  1.  Pancytopenia.  2.  Advanced human immunodeficiency virus disease.  3.  Malnutrition.  4.  Nonadherence to HIV medications.  5.  Depression.   CONSULTATIONS:  Dr. Lars Mage.   HISTORY OF PRESENT ILLNESS:  Krista Bean is a 45 year old African-American  female with HIV of seven years' duration who is a dialysis patient at the  [facility].  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  She has variable adherence to her  HIV medications.  She said in February she stopped taking them due to  depression.  She denied taking them through March but in April restarted  them again at the urging of her physicians.  On April 30 her CD-4 count was  less than 10.  She states she has been taking her medications again over the  past maybe six weeks or so but has had progressive weakness, poor oral  intake, watery diarrhea, 5-6 small to medium sized stools per day, and has  not gotten better.  She has had progressive anemia noted at dialysis down to  6.5 the day of admission.  She has also had leukopenia with her white count  in the 1.1 range and her phosphorus has been also low, most recently 1.1.  She started Questran without release of diarrhea but after taking Paregoric  yesterday ordered by Dr. Quentin Cornwall she has had no further diarrhea.  The  remainder of the history and physical and physical exam is outlined in the  admission history and physical.   HOSPITAL COURSE:  1.  PANCYTOPENIA.  A.  Neutropenia.  The patient was placed on neutropenic      precautions,  white cell redrawn.  Labs redrawn on May 26 showed total      white count of 800.  These were repeated several times thereafter and at      the time of discharge white count was 1000 with an ANC of 0.  Dr. Orene Desanctis      expressed that this form of neutropenia was different to that induced by      chemotherapy and the patient could be safely discharged home with a low      white count.  He expected the white count to correct if she continued to      take HIV medications as ordered.  B.  Anemia.  Hemoglobin was actually      5.8 on admission.  She received transfusion of one unit of packed red      cells with an increase in hemoglobin to 7.1.  She was then transfused  two units on dialysis on May 27 which brought her hemoglobin up to 10.4.      Her Hemoccult was checked x1 and found to be negative.  This was      probably a combination of HIV and end-stage renal disease.  Her Epogen      dose at the time of admission was 10,000 units.  She has adequate iron      stores and she will continue on Epogen 10,000 units at the time of      discharge.  C.  Pancytopenia.  Platelets were 173,000 on admission.      They had dropped to 126, then 134, and back down to 106,000 at the time      of discharge.  The patient is to have a repeat CBC done Monday, June 4,      to follow up on all these indices.  Additionally she will be on tight      heparin at dialysis.   1.  ADVANCED HIV DISEASE.  The patient received a consult by Dr. Orene Desanctis and      was also seen by Dr. Megan Salon during her hospitalization.  She does have      adequate supply of medications at home.  Her aunt was also recently      diagnosed with HIV.  I also discussed these medications at length with      her and that if she had any problems procuring them that she could ask      the social worker at the dialysis center for assistance.  Apparently she      has been purchasing some out of pocket at [employer] and we suggested that      [place] or [facility] might provide a less costly option.  She will continue      on the following HIV medications:  Viread weekly, Emtriva every fourth      day, Kaletra twice a day, Zithromax once a week for MAC prophylaxis, and      Dapsone for PCP prophylaxis.  Fortunately to this point she has not had      any opportunistic affections.  I have discussed with her how she might      use a calendar to map out how to take the Emtriva every fourth day      because she will not be taking it the same time every week which can be      confusing.  She seemed to understand this process.  She will follow up      with Dr. Quentin Cornwall in June and she had her CD-4 count and viral load      drawn yesterday.  The results are pending at the time of this dictation.   1.  DIARRHEA.  We had planned to check a variety of stool cultures and      studies during this hospitalization; however, with the Paregoric the      diarrhea ceased and it was likely related to HIV medications in some      fashion.  It is not a problem at this time; therefore, studies were not      done.   1.  NONADHERENCE TO HIV MEDICATIONS.  As discussed in #2.   1.  DEPRESSION.  This is understandable.  Dr. Orene Desanctis talked to the patient at      length regarding this.  She is not on any medication at this time.   1.  MALNUTRITION.  The patient has combination of very low phosphorus and a      15% weight loss.  She received dietary consult.  The patient has been      taking Nutra-Phos in the outpatient setting.  This is very costly and      she may have some lactose intolerance because she does have bloating if      she eats a lot of dairy products.  Nonetheless she will try to eat some      dairy products daily and her Nutra-Phos has been established at one      packet twice a day.  We will recheck calcium and phosphorus this Friday      at dialysis.  She had her phosphorus repleted during her hospitalization     and it was within normal limits at 3.8 at  the time of discharge.   LABORATORY DATA:  At the time of discharge:  White count 1 with ANC of 0,  hemoglobin 10.7. platelets 107,000.  Folic acid 3.5.  This came back after  the patient was discharged.  The dialysis center has been notified so she  will have supplemental folate added as part of her vitamin regime.  The 3.5  is in the low indeterminate range.  B12 is within normal limits at 293.  Sodium was 137 potassium 4.7, chloride 98, CO2 27, glucose 80, BUN 24,  creatinine 6.7, albumin 3.3, calcium 9.6, and phosphorus 3.8.  She also had  a chest x-ray on admission which showed no acute disease and she had a new  placement of a left upper arm graft on May 27 by Dr. Scot Dock.   DIET:  Renal diet.  Limit fluids to 1200 cc per day.  The patient may eat  dairy products as tolerated.   ACTIVITY:  Should increase slowly.   DISCHARGE MEDICATIONS:  1.  Nephro-Vite one tablet per day.  2.  Kaletra two tablets two times a day.  3.  Dapsone 100 mg Monday, Wednesday, Friday only.  4.  Viread 300 mg on Thursdays only.  5.  Emtriva 200 mg every fourth day.  6.  Zithromax 1200 mg every Sunday.  Prescription was given for this.  7.  Paregoric 1-2 teaspoons every six hours as needed.  8.  Benadryl 25 mg every eight hours as needed for itching.  9.  Nutra-Phos one packet twice a day.      Alric Seton, P.A.    ______________________________  Roney Jaffe, M.D.    MB/MEDQ  D:  03/10/2006  T:  03/10/2006  Job:  [id number]   cc:   Arelia Longest. Michelle Nasuti., M.D.  Fax: [phone]   [facility]   Sol Blazing, M.D.  Fax: [phone]

## 2011-02-28 NOTE — H&P (Signed)
NAME:  Krista Bean, Krista Bean NO.:  [id number]   MEDICAL RECORD NO.:  [id number]                   PATIENT TYPE:  EMS   LOCATION:  ED                                   FACILITY:  [facility]   PHYSICIAN:  Maudie Flakes. Hassell Done, M.D.                DATE OF BIRTH:  Aug 20, 1966   DATE OF ADMISSION:  01/03/2004  DATE OF DISCHARGE:                                HISTORY & PHYSICAL   Krista Bean is a 45 year old, black woman with a five-year history of HIV  (sees Krista Bean in clinic).  She came to [facility] ER today with a one-  week history of edema.  Blood pressure was 192/135, BUN 48, creatinine 11.8.  Renal ultrasound reportedly showed echo-dense kidneys.  She is admitted to  start dialysis and investigate the etiology of renal failure.  No  measurements of renal function can be found using E-chart back to 2001.   PAST MEDICAL HISTORY:  1. HIV since 1998.  Take Kaletra three tabs b.i.d. and blue pill once a     day.  2. She had breast reduction surgery in 1993.   REVIEW OF SYSTEMS:  Edema.  No hematuria.  No rash, no hematochezia.  No  melena, no arthritis, no loss of consciousness.  No seizures, no angina, no  claudication.   SOCIAL HISTORY:  She was born in [place], [place].  She graduated from  high school.  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  She does  not smoke cigarettes.  She drinks a wine cooler once or twice a month.  She  is married.  Her husband is HIV negative.  She is not sure how she got HIV.  She does not use IV drugs.   FAMILY HISTORY:  Mother, age 21, well.  Father, age 83, diabetic.  Four  sisters, one brother all well.  Two daughters are well.   PHYSICAL EXAMINATION:  GENERAL:  She is awake, alert, courteous.  VITAL SIGNS:  Temperature 98.4; pulse 112; respirations 20; blood pressure  148/100.  MOUTH:  Hyperpigmented areas on tongue.  No Candida.  No oral ulcers.  NECK:  Not stiff.  CHEST:  Clear.  HEART:  No rub.  ABDOMEN:   Nontender.  EXTREMITIES:  2+ pretibial edema.  NEURO:  No flap. Right-handed.  Strength, equal sensation intact.   Sodium 139, potassium 3.9, chloride 116, CO2 17, BUN 48, creatinine 11.8,  calcium 8.0, CK 141, hemoglobin 11.4, WBC 4,700, PLTS 210K.  CD4 count 90.   IMPRESSION/PLAN:  1. Renal failure.  If ultrasound report is true, then probably has HIV     nephropathy.  Will need to check urinalysis, save left arm for vascular     access, and arrange outpatient dialysis (she lives near [facility],     will probably     go to [facility] dialysis.  2. Hypertension.  Treat with labetalol p.o.  3. Human immunodeficiency virus.  Will ask Krista Bean to see.                                                Richard F. Hassell Done, M.D.    RFF/MEDQ  D:  01/03/2004  T:  01/04/2004  Job:  [id number]

## 2011-02-28 NOTE — Discharge Summary (Signed)
NAMEMCKYNLEE, KALEMBA                 ACCOUNT NO.:  [id number]   MEDICAL RECORD NO.:  [id number]          PATIENT TYPE:  INP   LOCATION:  [id number]                         FACILITY:  [facility]   PHYSICIAN:  Sherril Croon, M.D.   DATE OF BIRTH:  12-Aug-1966   DATE OF ADMISSION:  03/06/2006  DATE OF DISCHARGE:  03/10/2006                                 DISCHARGE SUMMARY   ADDENDUM:  The patient did not have an AV Gore-Tex graft placed by Dr.  Scot Dock on Mar 08, 2006.      Alric Seton, P.A.      Sherril Croon, M.D.  Electronically Signed    MB/MEDQ  D:  03/10/2006  T:  03/10/2006  Job:  [id number]

## 2011-02-28 NOTE — Discharge Summary (Signed)
NAME:  Krista Bean, DUSEL NO.:  [id number]   MEDICAL RECORD NO.:  [id number]                   PATIENT TYPE:  INP   LOCATION:  [id number]                                 FACILITY:  [facility]   PHYSICIAN:  Sherril Croon, M.D.                DATE OF BIRTH:  08/21/1966   DATE OF ADMISSION:  01/03/2004  DATE OF DISCHARGE:  01/05/2004                                 DISCHARGE SUMMARY   ADMISSION DIAGNOSES:  1. Renal failure.  2. Hypertension.  3. Human immunodeficiency virus.   DISCHARGE DIAGNOSES:  1. 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  2. Secondary hyperparathyroidism.  3. Hypertension.  4. Iron-deficiency anemia.  5. Human immunodeficiency virus.  6. Malnutrition.   BRIEF HISTORY:  A 45 year old black female with a five-year history of HIV  followed by Dr. Johnnye Sima in the outpatient clinic, presents to [facility]  Emergency Room with a one-week history of edema.  Her blood pressure was  found to be 192/135, BUN 48, creatinine 11.8.  Her renal ultrasound  reportedly showed echodense kidneys. She was admitted to start dialysis and  investigate the etiology of her renal failure.  No measurements of renal  function could be found using the E chart dating back to 2001.   ADMISSION LABORATORY AND X-RAY DATA:  Sodium 139, potassium 3.9, chloride  116, CO2 17, BUN 48, creatinine 11.8, calcium 8.0, creatinine kinase 141.  Hemoglobin 11.4, white count 4700, platelets 201,000,  CD4 count 90.   HOSPITAL COURSE:  The patient was admitted and underwent a dialysis catheter  placement in radiology by Dr. Kathlene Cote.  She tolerated it well and  thereafter underwent her first dialysis treatment on 24 March.  Dr. Johnnye Sima  saw the patient and started her on azithromycin at 1.2 g weekly along with  Dapsone 10 mg daily.  Her left arm was saved in anticipation of requiring a  permanent access.  Dietary and  dialysis education were provided.  Transferrin saturation returned low at 28%.  Intact parathyroid hormone was  elevated at 686.  A 24-hour urine for protein collection resulted in 9.4 g  protein.  Calcitriol therapy was started for secondary hyperparathyroidism.  InFeD and EPO therapy were started for iron-deficiency anemia .   Outpatient dialysis arrangements were made at the [facility] every Tuesday, Thursday, and Saturday.  Outpatient followup  appointment with [facility] will be made for permanent access placement in her  left arm.   She remained afebrile and hemodynamically stable.  She dialyzed a total of  two treatments in the hospital with the lowest post weight of 79.9 kg and  blood pressure being maintained above 150/90.  We will continue to take her  weight down for better blood pressure control.   DISCHARGE MEDICATIONS:  1. Nephro-Vite vitamin 1 daily.  2. Calcium  carbonate 500 mg 2 with meals.  3. Zithromax 600 mg 2 every Thursday after dialysis.  4. Dapsone 100 mg 1 every evening.  5. EPO 10,000 units IV in dialysis.  6. InFeD 100 mg IV each dialysis x 9, then weekly.  7. Calcitriol 1.0 mcg IV each dialysis x 3, then to be increased to 1.5 mcg     IV each dialysis.   Dialysis will be every Tuesday, Thursday, and Saturday at the [facility].  Dry weight to be determined.      Nonah Mattes, P.A.                      Sherril Croon, M.D.    RRK/MEDQ  D:  02/11/2004  T:  02/12/2004  Job:  [id number]   cc:   [facility]

## 2011-02-28 NOTE — Op Note (Signed)
NAME:  Krista Bean, PELLICANE NO.:  [id number]   MEDICAL RECORD NO.:  [id number]                   PATIENT TYPE:  OIB   LOCATION:  [id number]                                 FACILITY:  [facility]   PHYSICIAN:  Judeth Cornfield. Scot Dock, M.D.        DATE OF BIRTH:  1966/05/14   DATE OF PROCEDURE:  03/08/2004  DATE OF DISCHARGE:  03/08/2004                                 OPERATIVE REPORT   PREOPERATIVE DIAGNOSIS:  Chronic renal failure.   POSTOPERATIVE DIAGNOSIS:  Chronic renal failure.   PROCEDURE:  Placement of new left upper arm arteriovenous graft.   SURGEON:  Judeth Cornfield. Scot Dock, M.D.   ASSISTANT:  Hoyle Sauer A. Dub Mikes, P.A.-C.   ANESTHESIA:  Local with sedation.   SURGICAL TECHNIQUE:  The patient was taken to the operating room, sedated by  anesthesia.  The left upper extremity was prepped and draped in the usual  sterile fashion.  After the skin was infiltrated with 1% lidocaine, I made a  transverse incision at the antecubital level.  Here, the cephalic vein was  dissected free.  I ligated it distally and it took a 3.5 mm dilator but  would not take a 4 mm dilator and I did not think it was adequate size for a  fistula.  It did not dilate very well.  This vein was then ligated.  The  artery at this level was quite small and upon interrogation with the Doppler  it appeared there was a radial and ulnar branch at the antecubital level.  For this reason, I felt that the patient likely had a high bifurcation of  the brachial artery and that the patient would require an upper arm loop  graft.  A separate longitudinal incision was made beneath the axilla after  the skin was anesthetized and here, the high brachial vein and high brachial  artery were dissected free.  A 4-7 mm graft was then tunneled in a looped  fashion in the upper arm with the arterial aspect of the graft along the  radial aspect of the upper arm.  The patient was then heparinized.  The  brachial  artery was clamped proximally and distally and a longitudinal  arteriotomy was made.  A segment of the 4 mm end of the graft was excised,  the graft slightly spatulated and sewn end-to-side to the brachial artery  using continuous 6-0 Prolene suture.  The graft was then pulled to the  appropriate length for anastomosis to the brachial vein.  The vein was  ligated distally and spatulated.  The graft was cut to the appropriate  length, spatulated, and sewn end-to-end to the vein using continuous 6-0  Prolene suture.  At the completion, there was an excellent thrill in the  graft and a good signal with the Doppler at the wrist.  Hemostasis was  obtained in the wounds.  The wounds were closed with a deep layer of 3-0  Vicryl and the skin was closed with 4-0 Vicryl.  A sterile dressing was  applied.  The patient tolerated the procedure well and was transferred to  the recovery room in satisfactory condition.  All needle and sponge counts  were correct.                                               Judeth Cornfield. Scot Dock, M.D.    CSD/MEDQ  D:  03/08/2004  T:  03/09/2004  Job:  [id number]

## 2011-02-28 NOTE — H&P (Signed)
NAMEEVELENE, KUBAL                 ACCOUNT NO.:  [id number]   MEDICAL RECORD NO.:  [id number]          PATIENT TYPE:  INP   LOCATION:  [id number]                         FACILITY:  [facility]   PHYSICIAN:  Sol Blazing, M.D.DATE OF BIRTH:  17-May-1966   DATE OF ADMISSION:  03/06/2006  DATE OF DISCHARGE:                                HISTORY & PHYSICAL   CHIEF COMPLAINT:  Anemia, weakness, diarrhea, failure to thrive.   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  She has variable adherence to her  HIV meds.  Says in February she stopped taking them due to depression.  She did not take them through March but in April restarted again at the  urging of her physicians.  On April 30th, her CD4 count was less than 10.  She says that she has been taking her meds again over the past maybe six  weeks or so but has had progressive weakness, poor oral intake, watery  diarrhea, 5-6 small-to-medium sized stools a day, which has not gotten  better.  She has had a progressive anemia noted at dialysis, hemoglobin of  10.5 on May 9th, down to 6.5 on last week.  She has leukopenia.  On labs  today, white blood count is 1.1, phosphorus is 1.1.  She was started on  Questran without relief of diarrhea, but after taking paregoric yesterday  ordered by infectious disease, she has had no further diarrhea.   PAST MEDICAL HISTORY:  1.  HIV in 1998.  2.  Breast reduction in 1993.  3.  Hypertension.  4.  Iron-deficiency anemia.  5.  ESRD, as above.  6.  Past history of diabetes.   SOCIAL HISTORY:  Mother is alive.  Father, 78, diabetic.  Four sisters and  one brother.  Lives with her husband.  She has two daughters.   DRUG ALLERGIES:  NKDA.   MEDICATIONS:  1.  Viread 300 mg weekly on Thursdays only.  2.  Kaletra two tablets b.i.d.  3.  Emtriva 200 mg every  4th day.  4.  Dapsone 100 mg on Monday, Wednesday, Friday.  5.  Paregoric 1-2 as needed.   REVIEW OF SYSTEMS:  GENERAL:  Denies fever, chills, sweats.  Denies sore  throat, difficutly swallowing, change in hearing or vision.  GI:  As above.  GU: denies dysuria, hematuria, frequency.  MUSCULOSKELETAL:  denies myalgia,  athralgia, swelling.   PHYSICAL EXAMINATION:  VITAL SIGNS:  Blood pressure 130/57, pulse 80,  respirations 18, temp 99.5.  GENERAL:  Patient is alert and oriented in no distress.  She is calm and  comfortable.  HEENT:  PERRL, EOMI.  Throat is clear.  Neck is supple.  No lymphadenopathy.  CARDIAC:  Regular rate and rhythm.  No murmur, rubs or gallops.  ABDOMEN:  Soft, flat, nontender.  No HSM.   LAB:  Hemoglobin 5.8, white blood count 1.1   IMPRESSION:  1.  Anemia, progressive, probably due to  human immunodeficiency virus      disease, rule out infection.  2.  Diarrhea, multiple possible infectious causes including HIV,      cryptosporidium.  3.  Advanced AIDS, which recent medicaiton noncompliance and low CD4.  4.  ESRD.  5.  Failure to thrive.  6.  Hypertension.   PLAN:  1.  Admit.  2.  Transuse PRBC's.  3.  Dialysis.  4.  ID consult.  5.  Replete phosphorus.  6.  Stool cultures.  Treat diarrhea symptomatically.      Sol Blazing, M.D.  Electronically Signed     RDS/MEDQ  D:  03/06/2006  T:  03/06/2006  Job:  [id number]

## 2011-03-21 ENCOUNTER — Other Ambulatory Visit: Payer: Self-pay | Admitting: Nephrology

## 2011-03-21 ENCOUNTER — Encounter (HOSPITAL_COMMUNITY): Payer: Private Health Insurance - Indemnity | Attending: Nephrology

## 2011-03-21 DIAGNOSIS — E785 Hyperlipidemia, unspecified: Secondary | ICD-10-CM | POA: Insufficient documentation

## 2011-03-21 DIAGNOSIS — N2581 Secondary hyperparathyroidism of renal origin: Secondary | ICD-10-CM | POA: Insufficient documentation

## 2011-03-21 DIAGNOSIS — N184 Chronic kidney disease, stage 4 (severe): Secondary | ICD-10-CM | POA: Insufficient documentation

## 2011-03-21 DIAGNOSIS — Z940 Kidney transplant status: Secondary | ICD-10-CM | POA: Insufficient documentation

## 2011-03-21 DIAGNOSIS — D638 Anemia in other chronic diseases classified elsewhere: Secondary | ICD-10-CM | POA: Insufficient documentation

## 2011-03-23 LAB — BK VIRUS QUANT PCR, URINE: BK virus DNA, quant PCR: <510 copies/mL (ref <500)

## 2011-03-24 LAB — CBC
HCT: 31.5 % — ABNORMAL LOW (ref 36.0–46.0)
MCHC: 33 g/dL (ref 30.0–36.0)
RDW: 14.4 % (ref 11.5–15.5)

## 2011-03-24 LAB — PROTEIN / CREATININE RATIO, URINE
Creatinine, Urine: 115.25 mg/dL
Protein Creatinine Ratio: 0.07 (ref 0.00–0.15)
Total Protein, Urine: 8.5 mg/dL

## 2011-03-24 LAB — IRON AND TIBC
Iron: 68 ug/dL (ref 42–135)
UIBC: 191 ug/dL

## 2011-03-24 LAB — COMPREHENSIVE METABOLIC PANEL
Albumin: 3.5 g/dL (ref 3.5–5.2)
Alkaline Phosphatase: 116 U/L (ref 39–117)
BUN: 53 mg/dL — ABNORMAL HIGH (ref 6–23)
Potassium: 3.9 mEq/L (ref 3.5–5.1)
Sodium: 135 mEq/L (ref 135–145)
Total Protein: 7.3 g/dL (ref 6.0–8.3)

## 2011-03-24 LAB — URINALYSIS, ROUTINE W REFLEX MICROSCOPIC
Ketones, ur: NEGATIVE mg/dL
Leukocytes, UA: NEGATIVE
Nitrite: NEGATIVE
Protein, ur: NEGATIVE mg/dL

## 2011-03-24 LAB — TACROLIMUS LEVEL: Tacrolimus (FK506) - LabCorp: 3.5 ng/mL

## 2011-03-24 LAB — PHOSPHORUS: Phosphorus: 3.5 mg/dL (ref 2.3–4.6)

## 2011-03-27 LAB — MISCELLANEOUS TEST

## 2011-04-18 ENCOUNTER — Encounter (HOSPITAL_COMMUNITY): Payer: Medicare Other

## 2011-06-03 ENCOUNTER — Other Ambulatory Visit: Payer: Self-pay | Admitting: Nephrology

## 2011-06-03 ENCOUNTER — Encounter (HOSPITAL_COMMUNITY): Payer: Private Health Insurance - Indemnity | Attending: Nephrology

## 2011-06-03 DIAGNOSIS — E785 Hyperlipidemia, unspecified: Secondary | ICD-10-CM | POA: Insufficient documentation

## 2011-06-03 DIAGNOSIS — D638 Anemia in other chronic diseases classified elsewhere: Secondary | ICD-10-CM | POA: Insufficient documentation

## 2011-06-03 DIAGNOSIS — N184 Chronic kidney disease, stage 4 (severe): Secondary | ICD-10-CM | POA: Insufficient documentation

## 2011-06-03 DIAGNOSIS — N2581 Secondary hyperparathyroidism of renal origin: Secondary | ICD-10-CM | POA: Insufficient documentation

## 2011-06-03 DIAGNOSIS — Z940 Kidney transplant status: Secondary | ICD-10-CM | POA: Insufficient documentation

## 2011-06-03 LAB — COMPREHENSIVE METABOLIC PANEL
AST: 10 U/L (ref 0–37)
CO2: 14 mEq/L — ABNORMAL LOW (ref 19–32)
Calcium: 9.6 mg/dL (ref 8.4–10.5)
Creatinine, Ser: 6.13 mg/dL — ABNORMAL HIGH (ref 0.50–1.10)
GFR calc Af Amer: 9 mL/min — ABNORMAL LOW (ref >60)
GFR calc non Af Amer: 7 mL/min — ABNORMAL LOW (ref >60)
Glucose, Bld: 116 mg/dL — ABNORMAL HIGH (ref 70–99)
Sodium: 140 mEq/L (ref 135–145)
Total Protein: 7.5 g/dL (ref 6.0–8.3)

## 2011-06-03 LAB — CBC
MCV: 99.3 fL (ref 78.0–100.0)
Platelets: 162 10*3/uL (ref 150–400)
RBC: 2.91 MIL/uL — ABNORMAL LOW (ref 3.87–5.11)
RDW: 15 % (ref 11.5–15.5)
WBC: 9.1 10*3/uL (ref 4.0–10.5)

## 2011-06-03 LAB — PROTEIN / CREATININE RATIO, URINE
Creatinine, Urine: 166.58 mg/dL
Protein Creatinine Ratio: 0.05 (ref 0.00–0.15)
Total Protein, Urine: 8.5 mg/dL

## 2011-06-03 LAB — URINALYSIS, ROUTINE W REFLEX MICROSCOPIC
Bilirubin Urine: NEGATIVE
Leukocytes, UA: NEGATIVE
Nitrite: NEGATIVE
Specific Gravity, Urine: 1.012 (ref 1.005–1.030)
Urobilinogen, UA: 0.2 mg/dL (ref 0.0–1.0)
pH: 5.5 (ref 5.0–8.0)

## 2011-06-03 LAB — URINE MICROSCOPIC-ADD ON

## 2011-06-05 LAB — BK VIRUS QUANT PCR, URINE: BK virus DNA, quant PCR: <500 copies/mL (ref <500)

## 2011-06-10 LAB — MISCELLANEOUS TEST

## 2011-07-01 ENCOUNTER — Encounter (HOSPITAL_COMMUNITY): Payer: Medicare Other

## 2011-07-04 LAB — T-HELPER CELL (CD4) - (RCID CLINIC ONLY)
CD4 % Helper T Cell: 14 — ABNORMAL LOW
CD4 T Cell Abs: 80 — ABNORMAL LOW

## 2011-07-10 LAB — T-HELPER CELL (CD4) - (RCID CLINIC ONLY): CD4 % Helper T Cell: 11 — ABNORMAL LOW

## 2011-07-22 LAB — T-HELPER CELL (CD4) - (RCID CLINIC ONLY): CD4 T Cell Abs: 270 — ABNORMAL LOW

## 2011-08-20 ENCOUNTER — Other Ambulatory Visit (HOSPITAL_COMMUNITY): Payer: Self-pay | Admitting: *Deleted

## 2011-08-21 ENCOUNTER — Encounter (HOSPITAL_COMMUNITY)
Admission: RE | Admit: 2011-08-21 | Discharge: 2011-08-21 | Disposition: A | Discharge status: Home / Self Care | Payer: Private Health Insurance - Indemnity | Source: Ambulatory Visit | Attending: Nephrology | Admitting: Nephrology

## 2011-08-21 DIAGNOSIS — N184 Chronic kidney disease, stage 4 (severe): Secondary | ICD-10-CM | POA: Insufficient documentation

## 2011-08-21 DIAGNOSIS — D638 Anemia in other chronic diseases classified elsewhere: Secondary | ICD-10-CM | POA: Insufficient documentation

## 2011-08-21 DIAGNOSIS — N2581 Secondary hyperparathyroidism of renal origin: Secondary | ICD-10-CM | POA: Insufficient documentation

## 2011-08-21 DIAGNOSIS — Z940 Kidney transplant status: Secondary | ICD-10-CM | POA: Insufficient documentation

## 2011-08-21 DIAGNOSIS — E785 Hyperlipidemia, unspecified: Secondary | ICD-10-CM | POA: Insufficient documentation

## 2011-08-21 LAB — CBC
Hemoglobin: 10.5 g/dL — ABNORMAL LOW (ref 12.0–15.0)
MCHC: 33 g/dL (ref 30.0–36.0)
RBC: 3.13 MIL/uL — ABNORMAL LOW (ref 3.87–5.11)
WBC: 11 10*3/uL — ABNORMAL HIGH (ref 4.0–10.5)

## 2011-08-21 LAB — COMPREHENSIVE METABOLIC PANEL
ALT: <5 U/L (ref 0–35)
AST: 17 U/L (ref 0–37)
Albumin: 3.6 g/dL (ref 3.5–5.2)
Alkaline Phosphatase: 104 U/L (ref 39–117)
Chloride: 102 mEq/L (ref 96–112)
Potassium: 4.3 mEq/L (ref 3.5–5.1)
Sodium: 134 mEq/L — ABNORMAL LOW (ref 135–145)
Total Bilirubin: 0.3 mg/dL (ref 0.3–1.2)
Total Protein: 7.8 g/dL (ref 6.0–8.3)

## 2011-08-21 LAB — IRON AND TIBC
Iron: 74 ug/dL (ref 42–135)
UIBC: 161 ug/dL (ref 125–400)

## 2011-08-21 LAB — PROTEIN / CREATININE RATIO, URINE
Creatinine, Urine: 128.7 mg/dL
Total Protein, Urine: 4.5 mg/dL

## 2011-08-21 LAB — PHOSPHORUS: Phosphorus: 3.6 mg/dL (ref 2.3–4.6)

## 2011-08-21 LAB — URINALYSIS, ROUTINE W REFLEX MICROSCOPIC
Glucose, UA: NEGATIVE mg/dL
Leukocytes, UA: NEGATIVE
Protein, ur: NEGATIVE mg/dL
Specific Gravity, Urine: 1.012 (ref 1.005–1.030)

## 2011-08-21 MED ORDER — EPOETIN ALFA 10000 UNIT/ML IJ SOLN: 20000.0000 [IU] | INTRAMUSCULAR | Status: DC

## 2011-08-21 MED ORDER — EPOETIN ALFA 20000 UNIT/ML IJ SOLN
INTRAMUSCULAR | Status: AC
  Filled 2011-08-21: qty 1

## 2011-08-22 LAB — TACROLIMUS LEVEL

## 2011-08-22 MED FILL — Epoetin Alfa Inj 20000 Unit/ML: INTRAMUSCULAR | Qty: 1 | Status: AC

## 2011-09-09 DIAGNOSIS — I272 Pulmonary hypertension, unspecified: Secondary | ICD-10-CM | POA: Insufficient documentation

## 2011-09-13 DIAGNOSIS — M87059 Idiopathic aseptic necrosis of unspecified femur: Secondary | ICD-10-CM | POA: Insufficient documentation

## 2011-09-18 ENCOUNTER — Encounter (HOSPITAL_COMMUNITY): Payer: Medicare Other

## 2011-10-16 ENCOUNTER — Encounter (HOSPITAL_COMMUNITY): Payer: Medicare Other

## 2011-10-22 ENCOUNTER — Encounter (HOSPITAL_COMMUNITY)
Admission: RE | Admit: 2011-10-22 | Discharge: 2011-10-22 | Disposition: A | Discharge status: Home / Self Care | Payer: Private Health Insurance - Indemnity | Source: Ambulatory Visit | Attending: Nephrology | Admitting: Nephrology

## 2011-10-22 DIAGNOSIS — N184 Chronic kidney disease, stage 4 (severe): Secondary | ICD-10-CM | POA: Insufficient documentation

## 2011-10-22 DIAGNOSIS — E785 Hyperlipidemia, unspecified: Secondary | ICD-10-CM | POA: Insufficient documentation

## 2011-10-22 DIAGNOSIS — D638 Anemia in other chronic diseases classified elsewhere: Secondary | ICD-10-CM | POA: Insufficient documentation

## 2011-10-22 DIAGNOSIS — N2581 Secondary hyperparathyroidism of renal origin: Secondary | ICD-10-CM | POA: Insufficient documentation

## 2011-10-22 DIAGNOSIS — Z940 Kidney transplant status: Secondary | ICD-10-CM | POA: Insufficient documentation

## 2011-10-22 LAB — COMPREHENSIVE METABOLIC PANEL
ALT: 5 U/L (ref 0–35)
Alkaline Phosphatase: 116 U/L (ref 39–117)
BUN: 75 mg/dL — ABNORMAL HIGH (ref 6–23)
CO2: 14 mEq/L — ABNORMAL LOW (ref 19–32)
Calcium: 10.2 mg/dL (ref 8.4–10.5)
GFR calc Af Amer: 8 mL/min — ABNORMAL LOW (ref >90)
GFR calc non Af Amer: 7 mL/min — ABNORMAL LOW (ref >90)
Glucose, Bld: 100 mg/dL — ABNORMAL HIGH (ref 70–99)
Potassium: 4.3 mEq/L (ref 3.5–5.1)
Total Protein: 8.1 g/dL (ref 6.0–8.3)

## 2011-10-22 LAB — CBC
HCT: 30.8 % — ABNORMAL LOW (ref 36.0–46.0)
Hemoglobin: 10.3 g/dL — ABNORMAL LOW (ref 12.0–15.0)
MCH: 33.1 pg (ref 26.0–34.0)
MCHC: 33.4 g/dL (ref 30.0–36.0)
RBC: 3.11 MIL/uL — ABNORMAL LOW (ref 3.87–5.11)

## 2011-10-22 LAB — MISCELLANEOUS TEST

## 2011-10-22 LAB — PHOSPHORUS: Phosphorus: 4.6 mg/dL (ref 2.3–4.6)

## 2011-10-22 MED ORDER — EPOETIN ALFA 10000 UNIT/ML IJ SOLN: 20000.0000 [IU] | INTRAMUSCULAR | Status: DC

## 2011-10-22 MED ORDER — EPOETIN ALFA 20000 UNIT/ML IJ SOLN
INTRAMUSCULAR | Status: AC
  Administered 2011-10-22: 20000 [IU] via SUBCUTANEOUS
  Filled 2011-10-22: qty 1

## 2011-10-22 NOTE — Progress Notes (Signed)
Per Gordy Savers, lab supervisor, BK virus ordered on lab slip and Derick will contact appropriate people to figure out how to get results to cross over to EPIC.

## 2011-11-17 ENCOUNTER — Other Ambulatory Visit (HOSPITAL_COMMUNITY): Payer: Self-pay | Admitting: *Deleted

## 2011-11-19 ENCOUNTER — Inpatient Hospital Stay (HOSPITAL_COMMUNITY): Admission: RE | Admit: 2011-11-19 | Payer: Medicare Other | Source: Ambulatory Visit

## 2011-11-21 ENCOUNTER — Encounter (HOSPITAL_COMMUNITY)
Admission: RE | Admit: 2011-11-21 | Discharge: 2011-11-21 | Disposition: A | Discharge status: Home / Self Care | Payer: Private Health Insurance - Indemnity | Source: Ambulatory Visit | Attending: Nephrology | Admitting: Nephrology

## 2011-11-21 DIAGNOSIS — N2581 Secondary hyperparathyroidism of renal origin: Secondary | ICD-10-CM | POA: Insufficient documentation

## 2011-11-21 DIAGNOSIS — Z940 Kidney transplant status: Secondary | ICD-10-CM | POA: Insufficient documentation

## 2011-11-21 DIAGNOSIS — D638 Anemia in other chronic diseases classified elsewhere: Secondary | ICD-10-CM | POA: Insufficient documentation

## 2011-11-21 DIAGNOSIS — E785 Hyperlipidemia, unspecified: Secondary | ICD-10-CM | POA: Insufficient documentation

## 2011-11-21 DIAGNOSIS — N184 Chronic kidney disease, stage 4 (severe): Secondary | ICD-10-CM | POA: Insufficient documentation

## 2011-11-21 LAB — CBC
Hemoglobin: 9 g/dL — ABNORMAL LOW (ref 12.0–15.0)
MCH: 32.6 pg (ref 26.0–34.0)
Platelets: 168 10*3/uL (ref 150–400)
RBC: 2.76 MIL/uL — ABNORMAL LOW (ref 3.87–5.11)
WBC: 8.6 10*3/uL (ref 4.0–10.5)

## 2011-11-21 LAB — COMPREHENSIVE METABOLIC PANEL
ALT: 6 U/L (ref 0–35)
AST: 13 U/L (ref 0–37)
Alkaline Phosphatase: 107 U/L (ref 39–117)
CO2: 17 mEq/L — ABNORMAL LOW (ref 19–32)
Chloride: 107 mEq/L (ref 96–112)
GFR calc Af Amer: 12 mL/min — ABNORMAL LOW (ref >90)
GFR calc non Af Amer: 10 mL/min — ABNORMAL LOW (ref >90)
Glucose, Bld: 89 mg/dL (ref 70–99)
Potassium: 3.5 mEq/L (ref 3.5–5.1)
Sodium: 137 mEq/L (ref 135–145)
Total Bilirubin: 0.3 mg/dL (ref 0.3–1.2)

## 2011-11-21 LAB — URINE MICROSCOPIC-ADD ON

## 2011-11-21 LAB — POCT HEMOGLOBIN-HEMACUE: Hemoglobin: 9.1 g/dL — ABNORMAL LOW (ref 12.0–15.0)

## 2011-11-21 LAB — IRON AND TIBC
Saturation Ratios: 35 % (ref 20–55)
TIBC: 216 ug/dL — ABNORMAL LOW (ref 250–470)

## 2011-11-21 LAB — URINALYSIS, ROUTINE W REFLEX MICROSCOPIC
Bilirubin Urine: NEGATIVE
Glucose, UA: NEGATIVE mg/dL
Hgb urine dipstick: NEGATIVE
Protein, ur: NEGATIVE mg/dL
Urobilinogen, UA: 0.2 mg/dL (ref 0.0–1.0)

## 2011-11-21 MED ORDER — EPOETIN ALFA 20000 UNIT/ML IJ SOLN
INTRAMUSCULAR | Status: AC
  Administered 2011-11-21: 11:00:00 via SUBCUTANEOUS
  Filled 2011-11-21: qty 1

## 2011-11-21 MED ORDER — EPOETIN ALFA 10000 UNIT/ML IJ SOLN: 20000.0000 [IU] | INTRAMUSCULAR | Status: DC

## 2011-11-22 LAB — BK VIRUS QUANT PCR, URINE: BK virus DNA, quant PCR: <500 copies/mL (ref <500)

## 2011-11-23 LAB — BK VIRUS, DNA, URINE: BK Virus DNA, Urine: 6968 copies/mL — ABNORMAL HIGH (ref <500)

## 2011-11-23 LAB — TACROLIMUS LEVEL: Tacrolimus (FK506) - LabCorp: 3.7 ng/mL

## 2011-12-17 ENCOUNTER — Other Ambulatory Visit (HOSPITAL_COMMUNITY): Payer: Self-pay | Admitting: *Deleted

## 2011-12-19 ENCOUNTER — Encounter (HOSPITAL_COMMUNITY)
Admission: RE | Admit: 2011-12-19 | Discharge: 2011-12-19 | Disposition: A | Discharge status: Home / Self Care | Payer: Private Health Insurance - Indemnity | Source: Ambulatory Visit | Attending: Nephrology | Admitting: Nephrology

## 2011-12-19 DIAGNOSIS — N2581 Secondary hyperparathyroidism of renal origin: Secondary | ICD-10-CM | POA: Insufficient documentation

## 2011-12-19 DIAGNOSIS — N184 Chronic kidney disease, stage 4 (severe): Secondary | ICD-10-CM | POA: Insufficient documentation

## 2011-12-19 DIAGNOSIS — D638 Anemia in other chronic diseases classified elsewhere: Secondary | ICD-10-CM | POA: Insufficient documentation

## 2011-12-19 DIAGNOSIS — E785 Hyperlipidemia, unspecified: Secondary | ICD-10-CM | POA: Insufficient documentation

## 2011-12-19 DIAGNOSIS — Z940 Kidney transplant status: Secondary | ICD-10-CM | POA: Insufficient documentation

## 2011-12-19 LAB — COMPREHENSIVE METABOLIC PANEL
ALT: 5 U/L (ref 0–35)
Alkaline Phosphatase: 130 U/L — ABNORMAL HIGH (ref 39–117)
BUN: 63 mg/dL — ABNORMAL HIGH (ref 6–23)
CO2: 15 mEq/L — ABNORMAL LOW (ref 19–32)
Chloride: 104 mEq/L (ref 96–112)
GFR calc Af Amer: 9 mL/min — ABNORMAL LOW (ref >90)
Glucose, Bld: 98 mg/dL (ref 70–99)
Potassium: 4.2 mEq/L (ref 3.5–5.1)
Sodium: 136 mEq/L (ref 135–145)
Total Bilirubin: 0.4 mg/dL (ref 0.3–1.2)
Total Protein: 8 g/dL (ref 6.0–8.3)

## 2011-12-19 LAB — IRON AND TIBC
Iron: 79 ug/dL (ref 42–135)
TIBC: 233 ug/dL — ABNORMAL LOW (ref 250–470)

## 2011-12-19 LAB — URINALYSIS, ROUTINE W REFLEX MICROSCOPIC
Bilirubin Urine: NEGATIVE
Hgb urine dipstick: NEGATIVE
Ketones, ur: NEGATIVE mg/dL
Specific Gravity, Urine: 1.016 (ref 1.005–1.030)
Urobilinogen, UA: 0.2 mg/dL (ref 0.0–1.0)

## 2011-12-19 LAB — PROTEIN / CREATININE RATIO, URINE
Creatinine, Urine: 194.03 mg/dL
Total Protein, Urine: 11.5 mg/dL

## 2011-12-19 LAB — POCT HEMOGLOBIN-HEMACUE: Hemoglobin: 10.7 g/dL — ABNORMAL LOW (ref 12.0–15.0)

## 2011-12-19 MED ORDER — EPOETIN ALFA 10000 UNIT/ML IJ SOLN
25000.0000 [IU] | INTRAMUSCULAR | Status: DC
  Administered 2011-12-19: 5000 [IU] via SUBCUTANEOUS
  Filled 2011-12-19: qty 1

## 2011-12-19 MED ORDER — EPOETIN ALFA 20000 UNIT/ML IJ SOLN
INTRAMUSCULAR | Status: AC
  Administered 2011-12-19: 20000 [IU] via SUBCUTANEOUS
  Filled 2011-12-19: qty 1

## 2011-12-21 LAB — BK VIRUS, DNA, URINE: BK Virus DNA, Urine: 3944 copies/mL — ABNORMAL HIGH (ref <500)

## 2011-12-22 ENCOUNTER — Encounter (HOSPITAL_COMMUNITY): Payer: Private Health Insurance - Indemnity

## 2011-12-22 LAB — PTH, INTACT AND CALCIUM
Calcium, Total (PTH): 9.5 mg/dL (ref 8.4–10.5)
PTH: 272.7 pg/mL — ABNORMAL HIGH (ref 14.0–72.0)

## 2011-12-23 ENCOUNTER — Encounter (HOSPITAL_COMMUNITY): Payer: Private Health Insurance - Indemnity

## 2011-12-24 ENCOUNTER — Encounter (HOSPITAL_COMMUNITY): Payer: Private Health Insurance - Indemnity

## 2011-12-25 ENCOUNTER — Encounter (HOSPITAL_COMMUNITY): Payer: Private Health Insurance - Indemnity

## 2011-12-26 ENCOUNTER — Encounter (HOSPITAL_COMMUNITY): Payer: Private Health Insurance - Indemnity

## 2011-12-29 ENCOUNTER — Encounter (HOSPITAL_COMMUNITY): Payer: Private Health Insurance - Indemnity

## 2011-12-30 ENCOUNTER — Encounter (HOSPITAL_COMMUNITY): Payer: Private Health Insurance - Indemnity

## 2011-12-31 ENCOUNTER — Encounter (HOSPITAL_COMMUNITY): Payer: Private Health Insurance - Indemnity

## 2012-01-01 ENCOUNTER — Encounter (HOSPITAL_COMMUNITY): Payer: Private Health Insurance - Indemnity

## 2012-01-02 ENCOUNTER — Encounter (HOSPITAL_COMMUNITY): Payer: Private Health Insurance - Indemnity

## 2012-01-05 ENCOUNTER — Encounter (HOSPITAL_COMMUNITY): Payer: Private Health Insurance - Indemnity

## 2012-01-06 ENCOUNTER — Encounter (HOSPITAL_COMMUNITY): Payer: Private Health Insurance - Indemnity

## 2012-01-06 ENCOUNTER — Other Ambulatory Visit: Payer: Self-pay

## 2012-01-07 ENCOUNTER — Encounter (HOSPITAL_COMMUNITY): Payer: Private Health Insurance - Indemnity

## 2012-01-08 ENCOUNTER — Encounter (HOSPITAL_COMMUNITY): Payer: Private Health Insurance - Indemnity

## 2012-01-09 ENCOUNTER — Encounter (HOSPITAL_COMMUNITY): Payer: Private Health Insurance - Indemnity

## 2012-01-12 ENCOUNTER — Encounter (HOSPITAL_COMMUNITY): Payer: Private Health Insurance - Indemnity

## 2012-01-13 ENCOUNTER — Encounter (HOSPITAL_COMMUNITY): Payer: Private Health Insurance - Indemnity

## 2012-01-13 ENCOUNTER — Other Ambulatory Visit (HOSPITAL_COMMUNITY): Payer: Self-pay | Admitting: *Deleted

## 2012-01-16 ENCOUNTER — Encounter (HOSPITAL_COMMUNITY): Payer: Medicare Other

## 2012-01-26 ENCOUNTER — Encounter (HOSPITAL_COMMUNITY)
Admission: RE | Admit: 2012-01-26 | Discharge: 2012-01-26 | Disposition: A | Discharge status: Home / Self Care | Payer: Private Health Insurance - Indemnity | Source: Ambulatory Visit | Attending: Nephrology | Admitting: Nephrology

## 2012-01-26 DIAGNOSIS — N184 Chronic kidney disease, stage 4 (severe): Secondary | ICD-10-CM | POA: Insufficient documentation

## 2012-01-26 DIAGNOSIS — Z940 Kidney transplant status: Secondary | ICD-10-CM | POA: Insufficient documentation

## 2012-01-26 DIAGNOSIS — N2581 Secondary hyperparathyroidism of renal origin: Secondary | ICD-10-CM | POA: Insufficient documentation

## 2012-01-26 DIAGNOSIS — D638 Anemia in other chronic diseases classified elsewhere: Secondary | ICD-10-CM | POA: Insufficient documentation

## 2012-01-26 DIAGNOSIS — E785 Hyperlipidemia, unspecified: Secondary | ICD-10-CM | POA: Insufficient documentation

## 2012-01-26 LAB — COMPREHENSIVE METABOLIC PANEL
ALT: 6 U/L (ref 0–35)
AST: 16 U/L (ref 0–37)
Alkaline Phosphatase: 110 U/L (ref 39–117)
CO2: 15 mEq/L — ABNORMAL LOW (ref 19–32)
Chloride: 110 mEq/L (ref 96–112)
Creatinine, Ser: 5.16 mg/dL — ABNORMAL HIGH (ref 0.50–1.10)
GFR calc non Af Amer: 9 mL/min — ABNORMAL LOW (ref >90)
Potassium: 4.5 mEq/L (ref 3.5–5.1)
Total Bilirubin: 0.1 mg/dL — ABNORMAL LOW (ref 0.3–1.2)

## 2012-01-26 LAB — URIC ACID: Uric Acid, Serum: 7.4 mg/dL — ABNORMAL HIGH (ref 2.4–7.0)

## 2012-01-26 LAB — CBC
MCV: 99.7 fL (ref 78.0–100.0)
Platelets: 181 10*3/uL (ref 150–400)
RBC: 2.93 MIL/uL — ABNORMAL LOW (ref 3.87–5.11)
WBC: 7.3 10*3/uL (ref 4.0–10.5)

## 2012-01-26 LAB — POCT HEMOGLOBIN-HEMACUE: Hemoglobin: 9.6 g/dL — ABNORMAL LOW (ref 12.0–15.0)

## 2012-01-26 MED ORDER — EPOETIN ALFA 10000 UNIT/ML IJ SOLN
INTRAMUSCULAR | Status: AC
  Administered 2012-01-26: 25000 [IU] via SUBCUTANEOUS
  Filled 2012-01-26: qty 1

## 2012-01-26 MED ORDER — EPOETIN ALFA 10000 UNIT/ML IJ SOLN
25000.0000 [IU] | INTRAMUSCULAR | Status: DC
  Administered 2012-01-26: 25000 [IU] via SUBCUTANEOUS

## 2012-01-26 MED ORDER — EPOETIN ALFA 10000 UNIT/ML IJ SOLN
INTRAMUSCULAR | Status: AC
  Filled 2012-01-26: qty 1

## 2012-01-26 MED ORDER — EPOETIN ALFA 20000 UNIT/ML IJ SOLN
INTRAMUSCULAR | Status: AC
  Filled 2012-01-26: qty 1

## 2012-01-27 LAB — IRON AND TIBC: UIBC: 170 ug/dL (ref 125–400)

## 2012-01-28 LAB — BK VIRUS, DNA, URINE: BK Virus DNA, Urine: 7169 copies/mL — ABNORMAL HIGH (ref <500)

## 2012-01-28 LAB — BK VIRUS QUANT PCR, URINE: BK virus DNA, quant PCR: <500 copies/mL (ref <500)

## 2012-01-28 LAB — TACROLIMUS LEVEL: Tacrolimus (FK506) - LabCorp: 3.4 ng/mL

## 2012-02-23 ENCOUNTER — Inpatient Hospital Stay (HOSPITAL_COMMUNITY): Admission: RE | Admit: 2012-02-23 | Payer: Medicare Other | Source: Ambulatory Visit

## 2012-03-15 ENCOUNTER — Other Ambulatory Visit (HOSPITAL_COMMUNITY): Payer: Self-pay | Admitting: *Deleted

## 2012-03-16 ENCOUNTER — Encounter (HOSPITAL_COMMUNITY)
Admission: RE | Admit: 2012-03-16 | Discharge: 2012-03-16 | Disposition: A | Discharge status: Home / Self Care | Payer: Private Health Insurance - Indemnity | Source: Ambulatory Visit | Attending: Nephrology | Admitting: Nephrology

## 2012-03-16 DIAGNOSIS — N2581 Secondary hyperparathyroidism of renal origin: Secondary | ICD-10-CM | POA: Insufficient documentation

## 2012-03-16 DIAGNOSIS — Z940 Kidney transplant status: Secondary | ICD-10-CM | POA: Insufficient documentation

## 2012-03-16 DIAGNOSIS — N184 Chronic kidney disease, stage 4 (severe): Secondary | ICD-10-CM | POA: Insufficient documentation

## 2012-03-16 DIAGNOSIS — E785 Hyperlipidemia, unspecified: Secondary | ICD-10-CM | POA: Insufficient documentation

## 2012-03-16 DIAGNOSIS — D638 Anemia in other chronic diseases classified elsewhere: Secondary | ICD-10-CM | POA: Insufficient documentation

## 2012-03-16 LAB — CBC
HCT: 29 % — ABNORMAL LOW (ref 36.0–46.0)
Hemoglobin: 9.7 g/dL — ABNORMAL LOW (ref 12.0–15.0)
MCH: 32.6 pg (ref 26.0–34.0)
MCHC: 33.4 g/dL (ref 30.0–36.0)
MCV: 97.3 fL (ref 78.0–100.0)
RDW: 15.6 % — ABNORMAL HIGH (ref 11.5–15.5)

## 2012-03-16 LAB — URINALYSIS, ROUTINE W REFLEX MICROSCOPIC
Bilirubin Urine: NEGATIVE
Ketones, ur: NEGATIVE mg/dL
Nitrite: NEGATIVE
Specific Gravity, Urine: 1.011 (ref 1.005–1.030)
Urobilinogen, UA: 0.2 mg/dL (ref 0.0–1.0)
pH: 5.5 (ref 5.0–8.0)

## 2012-03-16 LAB — PHOSPHORUS: Phosphorus: 3.3 mg/dL (ref 2.3–4.6)

## 2012-03-16 LAB — IRON AND TIBC: Iron: 63 ug/dL (ref 42–135)

## 2012-03-16 LAB — COMPREHENSIVE METABOLIC PANEL
Albumin: 3.7 g/dL (ref 3.5–5.2)
BUN: 54 mg/dL — ABNORMAL HIGH (ref 6–23)
Creatinine, Ser: 4.64 mg/dL — ABNORMAL HIGH (ref 0.50–1.10)
GFR calc Af Amer: 12 mL/min — ABNORMAL LOW (ref >90)
Glucose, Bld: 100 mg/dL — ABNORMAL HIGH (ref 70–99)
Total Bilirubin: 0.3 mg/dL (ref 0.3–1.2)
Total Protein: 7.9 g/dL (ref 6.0–8.3)

## 2012-03-16 LAB — URINE MICROSCOPIC-ADD ON

## 2012-03-16 MED ORDER — EPOETIN ALFA 10000 UNIT/ML IJ SOLN
25000.0000 [IU] | INTRAMUSCULAR | Status: DC
  Administered 2012-03-16: 5000 [IU] via SUBCUTANEOUS

## 2012-03-16 MED ORDER — EPOETIN ALFA 20000 UNIT/ML IJ SOLN
INTRAMUSCULAR | Status: AC
  Administered 2012-03-16: 20000 [IU] via SUBCUTANEOUS
  Filled 2012-03-16: qty 1

## 2012-03-16 MED ORDER — EPOETIN ALFA 10000 UNIT/ML IJ SOLN
INTRAMUSCULAR | Status: AC
  Filled 2012-03-16: qty 1

## 2012-04-13 ENCOUNTER — Encounter (HOSPITAL_COMMUNITY)
Admission: RE | Admit: 2012-04-13 | Discharge: 2012-04-13 | Disposition: A | Discharge status: Home / Self Care | Payer: Private Health Insurance - Indemnity | Source: Ambulatory Visit | Attending: Nephrology | Admitting: Nephrology

## 2012-04-13 DIAGNOSIS — E785 Hyperlipidemia, unspecified: Secondary | ICD-10-CM | POA: Insufficient documentation

## 2012-04-13 DIAGNOSIS — N2581 Secondary hyperparathyroidism of renal origin: Secondary | ICD-10-CM | POA: Insufficient documentation

## 2012-04-13 DIAGNOSIS — N184 Chronic kidney disease, stage 4 (severe): Secondary | ICD-10-CM | POA: Insufficient documentation

## 2012-04-13 DIAGNOSIS — Z940 Kidney transplant status: Secondary | ICD-10-CM | POA: Insufficient documentation

## 2012-04-13 DIAGNOSIS — D638 Anemia in other chronic diseases classified elsewhere: Secondary | ICD-10-CM | POA: Insufficient documentation

## 2012-04-13 LAB — COMPREHENSIVE METABOLIC PANEL
AST: 12 U/L (ref 0–37)
Albumin: 3.5 g/dL (ref 3.5–5.2)
Alkaline Phosphatase: 109 U/L (ref 39–117)
BUN: 77 mg/dL — ABNORMAL HIGH (ref 6–23)
Chloride: 111 mEq/L (ref 96–112)
Potassium: 5.2 mEq/L — ABNORMAL HIGH (ref 3.5–5.1)
Sodium: 137 mEq/L (ref 135–145)
Total Bilirubin: 0.2 mg/dL — ABNORMAL LOW (ref 0.3–1.2)
Total Protein: 7.1 g/dL (ref 6.0–8.3)

## 2012-04-13 LAB — URINALYSIS, ROUTINE W REFLEX MICROSCOPIC
Bilirubin Urine: NEGATIVE
Glucose, UA: NEGATIVE mg/dL
Ketones, ur: NEGATIVE mg/dL
Nitrite: NEGATIVE
Specific Gravity, Urine: 1.015 (ref 1.005–1.030)
pH: 5.5 (ref 5.0–8.0)

## 2012-04-13 LAB — IRON AND TIBC
Iron: 57 ug/dL (ref 42–135)
Saturation Ratios: 24 % (ref 20–55)
TIBC: 240 ug/dL — ABNORMAL LOW (ref 250–470)
UIBC: 183 ug/dL (ref 125–400)

## 2012-04-13 LAB — CBC
HCT: 27.2 % — ABNORMAL LOW (ref 36.0–46.0)
MCHC: 32.4 g/dL (ref 30.0–36.0)
Platelets: 156 10*3/uL (ref 150–400)
RDW: 15 % (ref 11.5–15.5)
WBC: 6.5 10*3/uL (ref 4.0–10.5)

## 2012-04-13 LAB — PHOSPHORUS: Phosphorus: 5 mg/dL — ABNORMAL HIGH (ref 2.3–4.6)

## 2012-04-13 LAB — URINE MICROSCOPIC-ADD ON

## 2012-04-13 MED ORDER — EPOETIN ALFA 20000 UNIT/ML IJ SOLN
INTRAMUSCULAR | Status: AC
  Administered 2012-04-13: 20000 [IU] via SUBCUTANEOUS
  Filled 2012-04-13: qty 1

## 2012-04-13 MED ORDER — EPOETIN ALFA 10000 UNIT/ML IJ SOLN
25000.0000 [IU] | INTRAMUSCULAR | Status: DC
  Administered 2012-04-13: 10000 [IU] via SUBCUTANEOUS

## 2012-04-13 MED ORDER — EPOETIN ALFA 10000 UNIT/ML IJ SOLN
INTRAMUSCULAR | Status: AC
  Filled 2012-04-13: qty 1

## 2012-04-14 LAB — PTH, INTACT AND CALCIUM
Calcium, Total (PTH): 8.8 mg/dL (ref 8.4–10.5)
PTH: 502.4 pg/mL — ABNORMAL HIGH (ref 14.0–72.0)

## 2012-04-16 LAB — BK VIRUS QUANT PCR, URINE: BK virus DNA, quant PCR: <500 copies/mL (ref <500)

## 2012-04-28 ENCOUNTER — Encounter (HOSPITAL_COMMUNITY): Payer: Medicare Other

## 2012-04-29 ENCOUNTER — Encounter (HOSPITAL_COMMUNITY): Payer: Medicare Other

## 2012-06-16 ENCOUNTER — Other Ambulatory Visit: Payer: Self-pay

## 2012-06-16 DIAGNOSIS — T82898A Other specified complication of vascular prosthetic devices, implants and grafts, initial encounter: Secondary | ICD-10-CM

## 2012-06-21 ENCOUNTER — Encounter: Payer: Self-pay | Admitting: Vascular Surgery

## 2012-06-28 ENCOUNTER — Encounter: Payer: Self-pay | Admitting: Vascular Surgery

## 2012-06-29 ENCOUNTER — Encounter: Payer: Self-pay | Admitting: Vascular Surgery

## 2012-06-29 ENCOUNTER — Encounter: Payer: Self-pay | Admitting: *Deleted

## 2012-06-29 ENCOUNTER — Other Ambulatory Visit: Payer: Self-pay | Admitting: *Deleted

## 2012-06-29 ENCOUNTER — Ambulatory Visit (INDEPENDENT_AMBULATORY_CARE_PROVIDER_SITE_OTHER): Payer: Medicare Other | Admitting: Vascular Surgery

## 2012-06-29 ENCOUNTER — Encounter (INDEPENDENT_AMBULATORY_CARE_PROVIDER_SITE_OTHER): Payer: Medicare Other | Admitting: *Deleted

## 2012-06-29 VITALS — BP 119/71 | HR 47 | Resp 16 | Ht 66.0 in | Wt 151.0 lb

## 2012-06-29 DIAGNOSIS — N186 End stage renal disease: Secondary | ICD-10-CM

## 2012-06-29 DIAGNOSIS — N289 Disorder of kidney and ureter, unspecified: Secondary | ICD-10-CM

## 2012-06-29 DIAGNOSIS — T82898A Other specified complication of vascular prosthetic devices, implants and grafts, initial encounter: Secondary | ICD-10-CM

## 2012-06-29 NOTE — Progress Notes (Signed)
Subjective:     Patient ID: Krista Bean, female   DOB: 05-05-1966, 46 y.o.   MRN: [id number]  HPI this 46 year old female with end-stage renal disease has dialysis on Monday Wednesday and Friday. She has a right brachial-basilic AV fistula created by our practice about 3 years ago but only recently used. Is been some difficulty with flow particularly near the arterial end. She denies any pain or numbness in the right hand.  Past Medical History  Diagnosis Date  . HIV infection   . Peripheral neuropathy     Feet  . Hyperglycemia   . Renal failure     Hx. of Kidney transplantation  . ESRD (end stage renal disease)     History  Substance Use Topics  . Smoking status: Not on file  . Smokeless tobacco: Not on file  . Alcohol Use: Not on file    No family history on file.  Allergies  Allergen Reactions  . Ciprofloxacin Hives    Current outpatient prescriptions:abacavir (ZIAGEN) 300 MG tablet, , Disp: , Rfl: ;  allopurinol (ZYLOPRIM) 100 MG tablet, Twice daily., Disp: , Rfl: ;  ALPRAZolam (XANAX) 0.25 MG tablet, Take 0.25 mg by mouth at bedtime as needed., Disp: , Rfl: ;  hydrALAZINE (APRESOLINE) 50 MG tablet, daily., Disp: , Rfl: ;  HYDROcodone-acetaminophen (NORCO/VICODIN) 5-325 MG per tablet, Take 1 tablet by mouth every 6 (six) hours as needed., Disp: , Rfl:  ibuprofen (ADVIL,MOTRIN) 100 MG chewable tablet, Chew 100 mg by mouth as needed., Disp: , Rfl: ;  KALETRA 200-50 MG per tablet, Twice daily., Disp: , Rfl: ;  lamiVUDine (EPIVIR) 150 MG tablet, Take 150 mg by mouth 2 (two) times daily., Disp: , Rfl: ;  metoprolol (LOPRESSOR) 100 MG tablet, daily., Disp: , Rfl: ;  mycophenolate (CELLCEPT) 250 MG capsule, Take by mouth 2 (two) times daily., Disp: , Rfl:  pravastatin (PRAVACHOL) 80 MG tablet, Take 80 mg by mouth daily., Disp: , Rfl: ;  predniSONE (DELTASONE) 5 MG tablet, daily., Disp: , Rfl: ;  valGANciclovir (VALCYTE) 450 MG tablet, Take 450 mg by mouth daily., Disp: , Rfl: ;   zolpidem (AMBIEN) 10 MG tablet, Take 10 mg by mouth at bedtime as needed., Disp: , Rfl:   BP 119/71  Pulse 47  Resp 16  Ht 5\' 6"  (1.676 m)  Wt 151 lb (68.493 kg)  BMI 24.37 kg/m2  Body mass index is 24.37 kg/(m^2).          Review of Systems denies chest pain, dyspnea on exertion, PND, orthopnea.     Objective:   Physical Exam blood pressure 119/71 heart rate 47 respirations 16 General well-developed well-nourished female in no apparent stress alert and oriented x3 Lungs no rhonchi or wheezing Right upper extremity with excellent pulse and palpable thrill in brachial-basilic AV fistula. No laceration or pseudoaneurysm noted. 2+ radial pulse palpable distally. Right hand well-perfused.  Day I ordered a duplex scan of the fistula which reveals an area of high velocity near the arterial anastomosis with some partially occlusive debris noted in the vein     Assessment:     Poorly functioning AV fistula-right brachial-basilic with questionable area of intraluminal debris near arterial anastomosis    Plan:     Fistulogram per Dr. Bridgett Larsson on Thursday September of 19 with possible intervention depending on findings

## 2012-06-30 ENCOUNTER — Encounter (HOSPITAL_COMMUNITY): Payer: Self-pay | Admitting: Pharmacy Technician

## 2012-07-01 ENCOUNTER — Encounter (HOSPITAL_COMMUNITY)
Admission: RE | Disposition: A | Discharge status: Home / Self Care | Payer: Self-pay | Source: Ambulatory Visit | Attending: Vascular Surgery

## 2012-07-01 ENCOUNTER — Ambulatory Visit (HOSPITAL_COMMUNITY)
Admission: RE | Admit: 2012-07-01 | Discharge: 2012-07-01 | Disposition: A | Discharge status: Home / Self Care | Payer: Managed Care, Other (non HMO) | Source: Ambulatory Visit | Attending: Vascular Surgery | Admitting: Vascular Surgery

## 2012-07-01 DIAGNOSIS — T82898A Other specified complication of vascular prosthetic devices, implants and grafts, initial encounter: Secondary | ICD-10-CM

## 2012-07-01 DIAGNOSIS — Z940 Kidney transplant status: Secondary | ICD-10-CM | POA: Insufficient documentation

## 2012-07-01 DIAGNOSIS — N186 End stage renal disease: Secondary | ICD-10-CM | POA: Insufficient documentation

## 2012-07-01 DIAGNOSIS — R7309 Other abnormal glucose: Secondary | ICD-10-CM | POA: Insufficient documentation

## 2012-07-01 DIAGNOSIS — Z992 Dependence on renal dialysis: Secondary | ICD-10-CM | POA: Insufficient documentation

## 2012-07-01 DIAGNOSIS — Z21 Asymptomatic human immunodeficiency virus [HIV] infection status: Secondary | ICD-10-CM | POA: Insufficient documentation

## 2012-07-01 DIAGNOSIS — Y849 Medical procedure, unspecified as the cause of abnormal reaction of the patient, or of later complication, without mention of misadventure at the time of the procedure: Secondary | ICD-10-CM | POA: Insufficient documentation

## 2012-07-01 DIAGNOSIS — I120 Hypertensive chronic kidney disease with stage 5 chronic kidney disease or end stage renal disease: Secondary | ICD-10-CM | POA: Insufficient documentation

## 2012-07-01 HISTORY — PX: SHUNTOGRAM: SHX5491

## 2012-07-01 LAB — POCT I-STAT, CHEM 8
Calcium, Ion: 1.05 mmol/L — ABNORMAL LOW (ref 1.12–1.23)
Chloride: 105 mEq/L (ref 96–112)
Creatinine, Ser: 5.4 mg/dL — ABNORMAL HIGH (ref 0.50–1.10)
Glucose, Bld: 82 mg/dL (ref 70–99)
Glucose, Bld: 82 mg/dL (ref 70–99)
HCT: 39 % (ref 36.0–46.0)
Hemoglobin: 12.6 g/dL (ref 12.0–15.0)
Potassium: 3.8 mEq/L (ref 3.5–5.1)
TCO2: 27 mmol/L (ref 0–100)

## 2012-07-01 SURGERY — ASSESSMENT, SHUNT FUNCTION, WITH CONTRAST RADIOGRAPHIC STUDY
Anesthesia: LOCAL

## 2012-07-01 MED ORDER — LIDOCAINE HCL (PF) 1 % IJ SOLN
INTRAMUSCULAR | Status: AC
  Filled 2012-07-01: qty 30

## 2012-07-01 MED ORDER — HEPARIN (PORCINE) IN NACL 2-0.9 UNIT/ML-% IJ SOLN
INTRAMUSCULAR | Status: AC
  Filled 2012-07-01: qty 500

## 2012-07-01 MED ORDER — MIDAZOLAM HCL 2 MG/2ML IJ SOLN
INTRAMUSCULAR | Status: AC
  Filled 2012-07-01: qty 2

## 2012-07-01 MED ORDER — FENTANYL CITRATE 0.05 MG/ML IJ SOLN
INTRAMUSCULAR | Status: AC
  Filled 2012-07-01: qty 2

## 2012-07-01 MED ORDER — SODIUM CHLORIDE 0.9 % IJ SOLN: 3.0000 mL | INTRAMUSCULAR | Status: DC | PRN

## 2012-07-01 NOTE — H&P (View-Only) (Signed)
Subjective:     Patient ID: Krista Bean, female   DOB: May 24, 1966, 46 y.o.   MRN: [id number]  HPI this 46 year old female with end-stage renal disease has dialysis on Monday Wednesday and Friday. She has a right brachial-basilic AV fistula created by our practice about 3 years ago but only recently used. Is been some difficulty with flow particularly near the arterial end. She denies any pain or numbness in the right hand.  Past Medical History  Diagnosis Date  . HIV infection   . Peripheral neuropathy     Feet  . Hyperglycemia   . Renal failure     Hx. of Kidney transplantation  . ESRD (end stage renal disease)     History  Substance Use Topics  . Smoking status: Not on file  . Smokeless tobacco: Not on file  . Alcohol Use: Not on file    No family history on file.  Allergies  Allergen Reactions  . Ciprofloxacin Hives    Current outpatient prescriptions:abacavir (ZIAGEN) 300 MG tablet, , Disp: , Rfl: ;  allopurinol (ZYLOPRIM) 100 MG tablet, Twice daily., Disp: , Rfl: ;  ALPRAZolam (XANAX) 0.25 MG tablet, Take 0.25 mg by mouth at bedtime as needed., Disp: , Rfl: ;  hydrALAZINE (APRESOLINE) 50 MG tablet, daily., Disp: , Rfl: ;  HYDROcodone-acetaminophen (NORCO/VICODIN) 5-325 MG per tablet, Take 1 tablet by mouth every 6 (six) hours as needed., Disp: , Rfl:  ibuprofen (ADVIL,MOTRIN) 100 MG chewable tablet, Chew 100 mg by mouth as needed., Disp: , Rfl: ;  KALETRA 200-50 MG per tablet, Twice daily., Disp: , Rfl: ;  lamiVUDine (EPIVIR) 150 MG tablet, Take 150 mg by mouth 2 (two) times daily., Disp: , Rfl: ;  metoprolol (LOPRESSOR) 100 MG tablet, daily., Disp: , Rfl: ;  mycophenolate (CELLCEPT) 250 MG capsule, Take by mouth 2 (two) times daily., Disp: , Rfl:  pravastatin (PRAVACHOL) 80 MG tablet, Take 80 mg by mouth daily., Disp: , Rfl: ;  predniSONE (DELTASONE) 5 MG tablet, daily., Disp: , Rfl: ;  valGANciclovir (VALCYTE) 450 MG tablet, Take 450 mg by mouth daily., Disp: , Rfl: ;   zolpidem (AMBIEN) 10 MG tablet, Take 10 mg by mouth at bedtime as needed., Disp: , Rfl:   BP 119/71  Pulse 47  Resp 16  Ht 5\' 6"  (1.676 m)  Wt 151 lb (68.493 kg)  BMI 24.37 kg/m2  Body mass index is 24.37 kg/(m^2).          Review of Systems denies chest pain, dyspnea on exertion, PND, orthopnea.     Objective:   Physical Exam blood pressure 119/71 heart rate 47 respirations 16 General well-developed well-nourished female in no apparent stress alert and oriented x3 Lungs no rhonchi or wheezing Right upper extremity with excellent pulse and palpable thrill in brachial-basilic AV fistula. No laceration or pseudoaneurysm noted. 2+ radial pulse palpable distally. Right hand well-perfused.  Day I ordered a duplex scan of the fistula which reveals an area of high velocity near the arterial anastomosis with some partially occlusive debris noted in the vein     Assessment:     Poorly functioning AV fistula-right brachial-basilic with questionable area of intraluminal debris near arterial anastomosis    Plan:     Fistulogram per Dr. Bridgett Larsson on Thursday September of 19 with possible intervention depending on findings

## 2012-07-01 NOTE — Interval H&P Note (Signed)
[facility]  History and Physical Update  The patient was interviewed and re-examined.  The patient's previous History and Physical has been reviewed and is unchanged from Dr. Evelena Leyden evaluation on: 06/29/12.  There is no change in the plan of care: right arm fistulogram, possible intervention on arterial anastomosis.Adele Barthel, MD [facility] Office: [phone] Pager: [phone]  07/01/2012, 8:43 AM

## 2012-07-01 NOTE — Op Note (Signed)
OPERATIVE NOTE   PROCEDURE: 1.  right brachiocephalic arteriovenous fistula cannulation under ultrasound guidance 2.  right arm fistulogram 3.  Venoplasty of cephalic vein x 2 (4 mm x 40 mm, 6 mm x  40 mm)  PRE-OPERATIVE DIAGNOSIS: Malfunctioning right arteriovenous fistula  POST-OPERATIVE DIAGNOSIS: same as above   SURGEON: Adele Barthel, MD  ANESTHESIA: local  ESTIMATED BLOOD LOSS: 5 cc  FINDING(S): 1. Sclerotic distal cephalic vein, multiple clips visualized 2. Small pseudoaneurysm in distal 1/3 fistula 3. Stenosis in distal 1/3 fistula (~50-75%): <50% stenosis after serial angioplasty 4. Widely patent central venous structures  SPECIMEN(S):  None  CONTRAST: 50 cc  INDICATIONS: Krista Bean is a 46 y.o. female who presents with malfunctioning right brachiocephalic arteriovenous fistula.  The patient is scheduled for right arm fistulogram.  The patient is aware the risks include but are not limited to: bleeding, infection, thrombosis of the cannulated access, and possible anaphylactic reaction to the contrast.  The patient is aware of the risks of the procedure and elects to proceed forward.  DESCRIPTION: After full informed written consent was obtained, the patient was brought back to the angiography suite and placed supine upon the angiography table.  The patient was connected to monitoring equipment.  The right arm was prepped and draped in the standard fashion for a percutaneous access intervention.  Under ultrasound guidance, the right brachiocephalic arteriovenous fistula was cannulated with a micropuncture needle toward the arterial anastomosis.  The microwire was advanced into the fistula and the needle was exchanged for the a microsheath, which was lodged 2 cm into the access.  The wire was removed and the sheath was connected to the IV extension tubing.  Hand injections were completed to image the access from the antecubitum up to the level of axilla.  The central venous  structures were also imaged by hand injections.  A Benson wire was advanced into the distal cephalic vein with difficulty.  The sheath was exchanged for a short 6-Fr sheath.  Using a glidewire and end catheter, I was able to eventually get into the brachial artery.  The wire was exchanged for a [employer] wire.  I compressed the fistula and did a hand injection which refluxed the contrast dye into the brachial artery.  The findings are as listed above.  Based on the images, this patient will need: venoplasty of the perianastomotic segment.  Based on the the imaging, a 4 mm x 40 mm angioplasty balloon was selected.  The balloon was centered around the distal 1/3 of this fistula and inflated to 18 atm for 2 minutes.  On completion imaging, 50-75% residual stenosis is present.  At this point, the balloon was exchanged for a 6 mm x 40 angioplasty balloon.  The balloon was centered around the stenosis and inflated to 20 atm for 2 minutes.  On inflation, a waist was found and resolved after inflation.  On completion imaging, a ~50% residual stenosis is present.  I replaced the angioplasty balloon and reinflated it at the same location to 20 atm for 2 minutes.  On completion imaging, a <50% residual stenosis is present.  The thrill in this fistula was greatly improved, so I felt no further intervention was necessary.  The wire and balloon were removed from the sheath.  A 4-0 Monocryl purse-string suture was sewn around the sheath.  The sheath was removed while tying down the suture.  A sterile bandage was applied to the puncture site.  COMPLICATIONS: none  CONDITION: stable  Adele Barthel, MD [facility] Office: [phone] Pager: [phone]  07/01/2012 1:47 PM

## 2012-07-02 ENCOUNTER — Telehealth: Payer: Self-pay | Admitting: Vascular Surgery

## 2012-07-02 NOTE — Telephone Encounter (Addendum)
Message copied by Lujean Amel on Fri Jul 02, 2012 10:29 AM ------      Message from: Alfonso Patten      Created: Thu Jul 01, 2012  3:04 PM                   ----- Message -----         From: Conrad La Plata, MD         Sent: 07/01/2012   2:29 PM           To: Patrici Ranks, Alfonso Patten, RN            Krista Bean      [id number]      [birth year]            PROCEDURE:      1.  right brachiocephalic arteriovenous fistula cannulation under ultrasound guidance      2.  right arm fistulogram      3.  Venoplasty of cephalic vein x 2 (4 mm x 40 mm, 6 mm x  40 mm)            Follow-up: 4 weeks            I spoke w/ this pt regarding her appt on Fri 07/30/12 at 1pm and I also mailed her an appt letter. awt

## 2012-07-30 ENCOUNTER — Ambulatory Visit: Payer: Managed Care, Other (non HMO) | Admitting: Vascular Surgery

## 2012-08-05 ENCOUNTER — Encounter: Payer: Self-pay | Admitting: Vascular Surgery

## 2012-08-06 ENCOUNTER — Encounter: Payer: Self-pay | Admitting: Vascular Surgery

## 2012-08-06 ENCOUNTER — Ambulatory Visit (INDEPENDENT_AMBULATORY_CARE_PROVIDER_SITE_OTHER): Payer: Managed Care, Other (non HMO) | Admitting: Vascular Surgery

## 2012-08-06 VITALS — BP 126/80 | HR 69 | Resp 18 | Ht 66.5 in | Wt 155.0 lb

## 2012-08-06 DIAGNOSIS — N186 End stage renal disease: Secondary | ICD-10-CM

## 2012-08-06 NOTE — Progress Notes (Signed)
[facility]  Postoperative Visit  History of Present Illness  Krista Bean is a 46 y.o. female who presents for postoperative follow-up from procedure on Date: 07/01/12: venoplasty R BC AVF .  The patient's cannulation is healed.  The patient notes resolution of dialysis flow issues.  The patient is able to complete their activities of daily living.  The patient's current symptoms are: none.  Past Medical History, Past Surgical History, Social History, Family History, Medications, Allergies, and Review of Systems are unchanged from previous evaluation on 07/01/12.  Physical Examination  Filed Vitals:   08/06/12 1128  BP: 126/80  Pulse: 69  Resp: 18  Height: 5' 6.5" (1.689 m)  Weight: 155 lb (70.308 kg)   Body mass index is 24.64 kg/(m^2).  General: A&O x 3, WDWN  Pulmonary: Sym exp, good air movt, CTAB, no rales, rhonchi, & wheezing  Cardiac: RRR, Nl S1, S2, no Murmurs, rubs or gallops  Vascular:  palpable brachial artery,  palpable thrill,  bruit can be auscultated   Gastrointestinal: soft, NTND, -G/R, - HSM, - masses, - CVAT B  Musculoskeletal: M/S 5/5 throughout , Extremities without ischemic changes   Neurologic:  Pain and light touch intact in extremities , Motor exam as listed above  Medical Decision Making  Krista Bean is a 46 y.o. female who presents s/p venoplasty R BC AVF. Based on his angiographic findings, this patient needs: q3 month Access duplex to interrograte the cephalic vein stenosis. If re-stenosis occurs, the patient is to repeat the fistulogram with venoplasty in a primary assisted patency fashion. I discussed in depth with the patient the nature of atherosclerosis, and emphasized the importance of maximal medical management including strict control of blood pressure, blood glucose, and lipid levels, obtaining regular exercise, and cessation of smoking.   The patient is aware that without maximal medical management the  underlying atherosclerotic disease process will progress, limiting the benefit of any interventions.  Thank you for allowing [place] to participate in this patient's care.  Adele Barthel, MD [facility] [place] Office: [phone] Pager: [phone]

## 2012-09-13 ENCOUNTER — Other Ambulatory Visit: Payer: Self-pay | Admitting: Gynecology

## 2012-09-30 ENCOUNTER — Other Ambulatory Visit: Payer: Self-pay | Admitting: Nephrology

## 2012-09-30 DIAGNOSIS — Z940 Kidney transplant status: Secondary | ICD-10-CM

## 2012-10-01 ENCOUNTER — Other Ambulatory Visit: Payer: Self-pay | Admitting: *Deleted

## 2012-10-01 DIAGNOSIS — Z4931 Encounter for adequacy testing for hemodialysis: Secondary | ICD-10-CM

## 2012-10-01 DIAGNOSIS — N186 End stage renal disease: Secondary | ICD-10-CM

## 2012-10-05 ENCOUNTER — Other Ambulatory Visit: Payer: Managed Care, Other (non HMO)

## 2012-10-07 ENCOUNTER — Other Ambulatory Visit: Payer: Managed Care, Other (non HMO)

## 2012-10-11 ENCOUNTER — Ambulatory Visit
Admission: RE | Admit: 2012-10-11 | Discharge: 2012-10-11 | Disposition: A | Discharge status: Home / Self Care | Payer: 59 | Source: Ambulatory Visit | Attending: Nephrology | Admitting: Nephrology

## 2012-10-11 DIAGNOSIS — Z940 Kidney transplant status: Secondary | ICD-10-CM

## 2012-11-04 ENCOUNTER — Encounter: Payer: Self-pay | Admitting: Vascular Surgery

## 2012-11-05 ENCOUNTER — Ambulatory Visit: Payer: Managed Care, Other (non HMO) | Admitting: Vascular Surgery

## 2013-09-13 DIAGNOSIS — M109 Gout, unspecified: Secondary | ICD-10-CM | POA: Insufficient documentation

## 2013-10-04 ENCOUNTER — Other Ambulatory Visit: Payer: Self-pay | Admitting: Gynecology

## 2013-11-22 ENCOUNTER — Other Ambulatory Visit: Payer: Self-pay | Admitting: Obstetrics and Gynecology

## 2014-05-03 ENCOUNTER — Other Ambulatory Visit: Payer: Self-pay | Admitting: Gynecology

## 2014-05-13 HISTORY — PX: KIDNEY TRANSPLANT: SHX239

## 2014-06-09 DIAGNOSIS — F419 Anxiety disorder, unspecified: Secondary | ICD-10-CM | POA: Insufficient documentation

## 2014-09-21 ENCOUNTER — Encounter (HOSPITAL_COMMUNITY): Payer: Self-pay | Admitting: Vascular Surgery

## 2015-08-28 ENCOUNTER — Encounter: Payer: Self-pay | Admitting: Gastroenterology

## 2015-10-17 ENCOUNTER — Ambulatory Visit (AMBULATORY_SURGERY_CENTER): Payer: Self-pay | Admitting: *Deleted

## 2015-10-17 VITALS — Ht 66.0 in | Wt 191.0 lb

## 2015-10-17 DIAGNOSIS — Z1211 Encounter for screening for malignant neoplasm of colon: Secondary | ICD-10-CM

## 2015-10-17 MED ORDER — NA SULFATE-K SULFATE-MG SULF 17.5-3.13-1.6 GM/177ML PO SOLN: 1.0000 | Freq: Once | ORAL | Status: DC

## 2015-10-17 NOTE — Progress Notes (Signed)
No egg or soy allergy known to patient  No issues with past sedation with any surgeries  or procedures, no intubation problems  No diet pills No home 02 use per patient   emmi declined

## 2015-10-30 ENCOUNTER — Encounter: Payer: Self-pay | Admitting: Gastroenterology

## 2015-10-30 ENCOUNTER — Ambulatory Visit (AMBULATORY_SURGERY_CENTER): Payer: Medicare Other | Admitting: Gastroenterology

## 2015-10-30 VITALS — BP 133/76 | HR 65 | Temp 96.4°F | Resp 17 | Ht 66.0 in | Wt 191.0 lb

## 2015-10-30 DIAGNOSIS — D123 Benign neoplasm of transverse colon: Secondary | ICD-10-CM

## 2015-10-30 DIAGNOSIS — Z1211 Encounter for screening for malignant neoplasm of colon: Secondary | ICD-10-CM | POA: Diagnosis not present

## 2015-10-30 DIAGNOSIS — K635 Polyp of colon: Secondary | ICD-10-CM

## 2015-10-30 MED ORDER — SODIUM CHLORIDE 0.9 % IV SOLN: 500.0000 mL | INTRAVENOUS | Status: DC

## 2015-10-30 NOTE — Op Note (Signed)
[facility]  [address]. [place], [address]   COLONOSCOPY PROCEDURE REPORT  PATIENT: Krista Bean, Krista Bean  MR#: [id number] BIRTHDATE: 18-Mar-1966 , 49  yrs. old GENDER: female ENDOSCOPIST: Ladene Artist, MD, [facility] REFERRED BY: Laretta Alstrom, NP PROCEDURE DATE:  10/30/2015 PROCEDURE:   Colonoscopy, screening and Colonoscopy with biopsy First Screening Colonoscopy - Avg.  risk and is 50 yrs.  old or older Yes.  Prior Negative Screening - Now for repeat screening. N/A  History of Adenoma - Now for follow-up colonoscopy & has been > or = to 3 yrs.  N/A  Polyps removed today? Yes ASA CLASS:   Class III INDICATIONS:Screening for colonic neoplasia and Colorectal Neoplasm Risk Assessment for this procedure is average risk. MEDICATIONS: Monitored anesthesia care and Propofol 260 mg IV DESCRIPTION OF PROCEDURE:   After the risks benefits and alternatives of the procedure were thoroughly explained, informed consent was obtained.  The digital rectal exam revealed no abnormalities of the rectum.   The LB [id number]  endoscope was introduced through the anus and advanced to the cecum, which was identified by both the appendix and ileocecal valve. No adverse events experienced.   The quality of the prep was good.  (MiraLax was used)  The instrument was then slowly withdrawn as the colon was fully examined. Estimated blood loss is zero unless otherwise noted in this procedure report.    COLON FINDINGS: A sessile polyp measuring 4 mm in size was found in the transverse colon.  A polypectomy was performed with cold forceps.  The resection was complete, the polyp tissue was completely retrieved and sent to histology.   The colonic mucosa appeared normal in the descending colon, at the splenic flexure, in the rectum, sigmoid colon, at the ileocecal valve, cecum, hepatic flexure, and in the ascending colon.  Retroflexed views revealed internal Grade I hemorrhoids and  Hypertrophied anal papillae. The time to cecum = 1.7 Withdrawal time = 11.5   The scope was withdrawn and the procedure completed. COMPLICATIONS: There were no immediate complications.  ENDOSCOPIC IMPRESSION: 1.   Sessile polyp in the transverse colon; polypectomy performed with cold forceps 2.   Grade I internal hemorrhoids and hypertrophied anal papillae  RECOMMENDATIONS: 1.  Await pathology results 2.  Repeat colonoscopy in 5 years if polyp adenomatous; otherwise 10 years  eSigned:  Ladene Artist, MD, [facility] 10/30/2015 9:53 AM

## 2015-10-30 NOTE — Progress Notes (Signed)
Patient stating prior to prep, she had bright red blood on bowel movement.

## 2015-10-30 NOTE — Progress Notes (Signed)
Called to room to assist during endoscopic procedure.  Patient ID and intended procedure confirmed with present staff. Received instructions for my participation in the procedure from the performing physician.  

## 2015-10-30 NOTE — Patient Instructions (Signed)
YOU HAD AN ENDOSCOPIC PROCEDURE TODAY AT THE [facility]:   Refer to the procedure report that was given to you for any specific questions about what was found during the examination.  If the procedure report does not answer your questions, please call your gastroenterologist to clarify.  If you requested that your care partner not be given the details of your procedure findings, then the procedure report has been included in a sealed envelope for you to review at your convenience later.  YOU SHOULD EXPECT: Some feelings of bloating in the abdomen. Passage of more gas than usual.  Walking can help get rid of the air that was put into your GI tract during the procedure and reduce the bloating. If you had a lower endoscopy (such as a colonoscopy or flexible sigmoidoscopy) you may notice spotting of blood in your stool or on the toilet paper. If you underwent a bowel prep for your procedure, you may not have a normal bowel movement for a few days.  Please Note:  You might notice some irritation and congestion in your nose or some drainage.  This is from the oxygen used during your procedure.  There is no need for concern and it should clear up in a day or so.  SYMPTOMS TO REPORT IMMEDIATELY:   Following lower endoscopy (colonoscopy or flexible sigmoidoscopy):  Excessive amounts of blood in the stool  Significant tenderness or worsening of abdominal pains  Swelling of the abdomen that is new, acute  Fever of 100F or higher   For urgent or emergent issues, a gastroenterologist can be reached at any hour by calling [phone].   DIET: Your first meal following the procedure should be a small meal and then it is ok to progress to your normal diet. Heavy or fried foods are harder to digest and may make you feel nauseous or bloated.  Likewise, meals heavy in dairy and vegetables can increase bloating.  Drink plenty of fluids but you should avoid alcoholic beverages for 24  hours.  ACTIVITY:  You should plan to take it easy for the rest of today and you should NOT DRIVE or use heavy machinery until tomorrow (because of the sedation medicines used during the test).    FOLLOW UP: Our staff will call the number listed on your records the next business day following your procedure to check on you and address any questions or concerns that you may have regarding the information given to you following your procedure. If we do not reach you, we will leave a message.  However, if you are feeling well and you are not experiencing any problems, there is no need to return our call.  We will assume that you have returned to your regular daily activities without incident.  If any biopsies were taken you will be contacted by phone or by letter within the next 1-3 weeks.  Please call us at [phone] if you have not heard about the biopsies in 3 weeks.    SIGNATURES/CONFIDENTIALITY: You and/or your care partner have signed paperwork which will be entered into your electronic medical record.  These signatures attest to the fact that that the information above on your After Visit Summary has been reviewed and is understood.  Full responsibility of the confidentiality of this discharge information lies with you and/or your care-partner.   Resume medications. Information given on polyps,hemorrhoids and high fiber diet. 

## 2015-10-30 NOTE — Progress Notes (Signed)
Report to PACU, RN, vss, BBS= Clear.  

## 2015-10-31 ENCOUNTER — Telehealth: Payer: Self-pay

## 2015-10-31 NOTE — Telephone Encounter (Signed)
Left a message for the pt to call us back if any questions or concerns. maw

## 2015-11-08 ENCOUNTER — Encounter: Payer: Self-pay | Admitting: Gastroenterology

## 2016-01-12 ENCOUNTER — Encounter (HOSPITAL_COMMUNITY): Payer: Self-pay | Admitting: Emergency Medicine

## 2016-01-12 ENCOUNTER — Emergency Department (INDEPENDENT_AMBULATORY_CARE_PROVIDER_SITE_OTHER): Payer: Medicare Other

## 2016-01-12 ENCOUNTER — Emergency Department (INDEPENDENT_AMBULATORY_CARE_PROVIDER_SITE_OTHER)
Admission: EM | Admit: 2016-01-12 | Discharge: 2016-01-12 | Disposition: A | Discharge status: Home / Self Care | Payer: Medicare Other | Source: Home / Self Care | Attending: Emergency Medicine | Admitting: Emergency Medicine

## 2016-01-12 DIAGNOSIS — N179 Acute kidney failure, unspecified: Secondary | ICD-10-CM

## 2016-01-12 DIAGNOSIS — E860 Dehydration: Secondary | ICD-10-CM

## 2016-01-12 LAB — POCT I-STAT, CHEM 8
BUN: 41 mg/dL — ABNORMAL HIGH (ref 6–20)
CHLORIDE: 114 mmol/L — AB (ref 101–111)
CREATININE: 5.2 mg/dL — AB (ref 0.44–1.00)
Calcium, Ion: 1.42 mmol/L — ABNORMAL HIGH (ref 1.12–1.23)
GLUCOSE: 113 mg/dL — AB (ref 65–99)
HCT: 41 % (ref 36.0–46.0)
Hemoglobin: 13.9 g/dL (ref 12.0–15.0)
POTASSIUM: 3.8 mmol/L (ref 3.5–5.1)
Sodium: 138 mmol/L (ref 135–145)
TCO2: 12 mmol/L (ref 0–100)

## 2016-01-12 NOTE — ED Notes (Signed)
Reports dry cough for 3 weeks, noticed getting sob 3 days ago.  Patient was in the hospital last week secondary to developing diabetes.

## 2016-01-12 NOTE — ED Provider Notes (Signed)
CSN: [id number]     Arrival date & time 01/12/16  1311 History   First MD Initiated Contact with Patient 01/12/16 1405     Chief Complaint  Patient presents with  . Shortness of Breath   (Consider location/radiation/quality/duration/timing/severity/associated sxs/prior Treatment) Patient is a 50 y.o. female presenting with shortness of breath. The history is provided by the patient. No language interpreter was used.  Shortness of Breath Severity:  Moderate Onset quality:  Gradual Duration:  3 days Timing:  Constant Progression:  Worsening Chronicity:  New Context: activity   Relieved by:  Nothing Worsened by:  Nothing tried Ineffective treatments:  None tried Associated symptoms: abdominal pain   Risk factors: no recent surgery   Pt reports she has had diarrhea on and off.  Pt was just discharged from [facility] on 3/26 for hyperglycemia.  Pt complains of feeling short of breath/  Past Medical History  Diagnosis Date  . HIV infection ([place])   . Peripheral neuropathy (HCC)     Feet  . Hyperglycemia   . Renal failure     Hx. of Kidney transplantation  . ESRD (end stage renal disease) ([place])   . Allergy     seasonal  . Heart murmur   . Blood transfusion without reported diagnosis    Past Surgical History  Procedure Laterality Date  . Transposition avf  Feb. 22, 2011    Right  by Dr. Rosalia Hammers  . Kidney transplant Left 05-2014  . Shuntogram N/A 07/01/2012    Procedure: Earney Mallet;  Surgeon: Conrad [place], MD;  Location: [facility] CATH LAB;  Service: Cardiovascular;  Laterality: N/A;  . Tubal ligation  1993  . Hemorroidectomy      unsure of year but thinks 40  . Kidney transplant Right 2010  . Breast reduction surgery     Family History  Problem Relation Age of Onset  . Diabetes Father   . Colon cancer Neg Hx   . Colon polyps Neg Hx   . Esophageal cancer Neg Hx   . Rectal cancer Neg Hx   . Stomach cancer Neg Hx    Social History  Substance Use Topics  . Smoking status:  Never Smoker   . Smokeless tobacco: Never Used  . Alcohol Use: No   OB History    No data available     Review of Systems  Respiratory: Positive for shortness of breath.   Gastrointestinal: Positive for abdominal pain.  All other systems reviewed and are negative.   Allergies  Ciprofloxacin and Vicodin  Home Medications   Prior to Admission medications   Medication Sig Start Date End Date Taking? Authorizing Provider  insulin aspart (NOVOLOG) 100 UNIT/ML injection Inject into the skin 3 (three) times daily before meals.   Yes Historical Provider, MD  insulin detemir (LEVEMIR) 100 UNIT/ML injection Inject into the skin at bedtime.   Yes Historical Provider, MD  abacavir (ZIAGEN) 300 MG tablet Take 300 mg by mouth 2 (two) times daily.  03/24/12   Historical Provider, MD  allopurinol (ZYLOPRIM) 100 MG tablet Take 200 mg by mouth 2 (two) times daily as needed. For gout pain    Historical Provider, MD  ALPRAZolam (XANAX) 0.25 MG tablet Take 0.25 mg by mouth at bedtime as needed. Reported on 10/30/2015    Historical Provider, MD  aspirin EC 81 MG tablet Take 81 mg by mouth. 06/13/14   Historical Provider, MD  calcium acetate (PHOSLO) 667 MG capsule Take 2 capsules by mouth Three times  a day after meals. 06/14/12   Historical Provider, MD  cinacalcet (SENSIPAR) 30 MG tablet Take by mouth. 09/19/12   Historical Provider, MD  diphenhydrAMINE (BENADRYL) 25 mg capsule Take 25 mg by mouth. Reported on 10/30/2015    Historical Provider, MD  furosemide (LASIX) 20 MG tablet Reported on 10/30/2015 03/13/15   Historical Provider, MD  hydrALAZINE (APRESOLINE) 50 MG tablet Take 50 mg by mouth daily. Reported on 10/30/2015 03/17/12   Historical Provider, MD  HYDROcodone-acetaminophen (NORCO/VICODIN) 5-325 MG per tablet Reported on 10/30/2015 05/31/12   Historical Provider, MD  labetalol (NORMODYNE) 200 MG tablet TAKE 1 TABLET (200 MG TOTAL) BY MOUTH 2 TIMES DAILY. 03/13/15   Historical Provider, MD  lamiVUDine  (EPIVIR) 150 MG tablet Take 150 mg by mouth daily.     Historical Provider, MD  metoprolol (LOPRESSOR) 100 MG tablet Take 50 mg by mouth 2 (two) times daily.  03/18/12   Historical Provider, MD  phosphorus (K-PHOS-NEUTRAL) [id number] MG tablet Take 500 mg by mouth. 10/25/14   Historical Provider, MD  pravastatin (PRAVACHOL) 80 MG tablet Take 80 mg by mouth daily.    Historical Provider, MD  predniSONE (DELTASONE) 5 MG tablet Take 5 mg by mouth daily.  03/24/12   Historical Provider, MD  raltegravir (ISENTRESS) 400 MG tablet TAKE 1 TABLET (400 MG TOTAL) BY MOUTH 2 TIMES DAILY. 08/23/15   Historical Provider, MD  SENSIPAR 30 MG tablet  07/20/12   Historical Provider, MD  sulfamethoxazole-trimethoprim (BACTRIM DS) 800-160 MG per tablet Take 1 tablet by mouth 3 (three) times a week. 06/15/12   Historical Provider, MD  tacrolimus (PROGRAF) 1 MG capsule Take 5 mg by mouth. 03/14/15   Historical Provider, MD  Vitamin D, Ergocalciferol, (DRISDOL) 50000 units CAPS capsule 1 (ONE) CAPSULE BY MOUTH TWICE PER WEEK 09/28/15   Historical Provider, MD  zolpidem (AMBIEN) 10 MG tablet Take 10 mg by mouth at bedtime as needed. Reported on 10/30/2015    Historical Provider, MD   Meds Ordered and Administered this Visit  Medications - No data to display  BP 131/71 mmHg  Pulse 109  Temp(Src) 98.1 F (36.7 C) (Oral)  SpO2 100% No data found.   Physical Exam  Constitutional: She is oriented to person, place, and time. She appears well-developed and well-nourished.  HENT:  Head: Normocephalic.  Eyes: EOM are normal.  Neck: Normal range of motion.  Cardiovascular: Normal rate.   Murmur heard. Pulmonary/Chest: Effort normal and breath sounds normal.  Abdominal: She exhibits no distension.  Musculoskeletal: Normal range of motion.  Neurological: She is alert and oriented to person, place, and time.  Psychiatric: She has a normal mood and affect.  Nursing note and vitals reviewed.   ED Course  Procedures  (including critical care time)  Labs Review Labs Reviewed  POCT I-STAT, CHEM 8 - Abnormal; Notable for the following:    Chloride 114 (*)    BUN 41 (*)    Creatinine, Ser 5.20 (*)    Glucose, Bld 113 (*)    Calcium, Ion 1.42 (*)    All other components within normal limits    Imaging Review Dg Chest 2 View  01/12/2016  CLINICAL DATA:  Cough. EXAM: CHEST  2 VIEW COMPARISON:  December 04, 2009. FINDINGS: The heart size and mediastinal contours are within normal limits. Both lungs are clear. The visualized skeletal structures are unremarkable. IMPRESSION: No active cardiopulmonary disease. Electronically Signed   By: Marijo Conception, M.D.   On: 01/12/2016 14:57  Visual Acuity Review  Right Eye Distance:   Left Eye Distance:   Bilateral Distance:    Right Eye Near:   Left Eye Near:    Bilateral Near:         MDM  I spoke to Dr. Mariel Aloe  And advised of change in labs.  Pt wants to go to [facility].  Pt declined tranfer to [facility] ED.     1. Acute renal failure, unspecified acute renal failure type ([place])   2. Dehydration    An After Visit Summary was printed and given to the patient.    [place], PA-C 01/12/16 1546

## 2016-01-12 NOTE — Discharge Instructions (Signed)
Go to [facility] ED for evaluation and treatment.

## 2016-03-05 ENCOUNTER — Ambulatory Visit (INDEPENDENT_AMBULATORY_CARE_PROVIDER_SITE_OTHER): Payer: Medicare Other | Admitting: Endocrinology

## 2016-03-05 ENCOUNTER — Encounter: Payer: Self-pay | Admitting: Endocrinology

## 2016-03-05 VITALS — BP 134/80 | HR 92 | Temp 98.5°F | Ht 66.5 in | Wt 188.0 lb

## 2016-03-05 DIAGNOSIS — R7309 Other abnormal glucose: Secondary | ICD-10-CM | POA: Diagnosis not present

## 2016-03-05 DIAGNOSIS — E119 Type 2 diabetes mellitus without complications: Secondary | ICD-10-CM

## 2016-03-05 DIAGNOSIS — R7303 Prediabetes: Secondary | ICD-10-CM | POA: Insufficient documentation

## 2016-03-05 LAB — POCT GLYCOSYLATED HEMOGLOBIN (HGB A1C): Hemoglobin A1C: 6.4

## 2016-03-05 NOTE — Progress Notes (Signed)
Subjective:    Patient ID: Krista Bean, female    DOB: 09-07-1966, 50 y.o.   MRN: [id number]  HPI pt states DM was dx'ed in early 2017, when she presented with glucose was in the 900's; she has mild neuropathy of the lower extremities, and associated ESRD (renal transplant in 2015) ; pt says her diet and exercise are good; she has never had GDM, pancreatitis, severe hypoglycemia or DKA.  She takes levemir and novolog since approx 1 month ago.  She says cbg's are in the low-100's.  There is no trend throughout the day.  It is in general higher as the day goes on.  She now takes prednisone 5 mg bid (on this dosage x approx 1-2 months).  Polyuria and fatigue are resolved.    Past Medical History  Diagnosis Date  . HIV infection ([place])   . Peripheral neuropathy (HCC)     Feet  . Hyperglycemia   . Renal failure     Hx. of Kidney transplantation  . ESRD (end stage renal disease) ([place])   . Allergy     seasonal  . Heart murmur   . Blood transfusion without reported diagnosis     Past Surgical History  Procedure Laterality Date  . Transposition avf  Feb. 22, 2011    Right  by Dr. Rosalia Hammers  . Kidney transplant Left 05-2014  . Shuntogram N/A 07/01/2012    Procedure: Earney Mallet;  Surgeon: Conrad [place], MD;  Location: [facility] CATH LAB;  Service: Cardiovascular;  Laterality: N/A;  . Tubal ligation  1993  . Hemorroidectomy      unsure of year but thinks 62  . Kidney transplant Right 2010  . Breast reduction surgery      Social History   Social History  . Marital Status: Divorced    Spouse Name: N/A  . Number of Children: N/A  . Years of Education: N/A   Occupational History  . Not on file.   Social History Main Topics  . Smoking status: Never Smoker   . Smokeless tobacco: Never Used  . Alcohol Use: No  . Drug Use: No  . Sexual Activity: Not on file   Other Topics Concern  . Not on file   Social History Narrative    Current Outpatient Prescriptions on File Prior to Visit    Medication Sig Dispense Refill  . abacavir (ZIAGEN) 300 MG tablet Take 300 mg by mouth 2 (two) times daily. Reported on 03/05/2016    . allopurinol (ZYLOPRIM) 100 MG tablet Take 200 mg by mouth 2 (two) times daily as needed. For gout pain    . ALPRAZolam (XANAX) 0.25 MG tablet Take 0.25 mg by mouth at bedtime as needed. Reported on 10/30/2015    . aspirin EC 81 MG tablet Take 81 mg by mouth.    . calcium acetate (PHOSLO) 667 MG capsule Take 2 capsules by mouth Three times a day after meals.    . cinacalcet (SENSIPAR) 30 MG tablet Take by mouth.    . diphenhydrAMINE (BENADRYL) 25 mg capsule Take 25 mg by mouth. Reported on 10/30/2015    . furosemide (LASIX) 20 MG tablet Reported on 10/30/2015    . HYDROcodone-acetaminophen (NORCO/VICODIN) 5-325 MG per tablet Reported on 10/30/2015    . insulin aspart (NOVOLOG) 100 UNIT/ML injection Inject 5 Units into the skin 3 (three) times daily before meals.     . insulin detemir (LEVEMIR) 100 UNIT/ML injection Inject 5 Units into the skin at  bedtime.     Marland Kitchen labetalol (NORMODYNE) 200 MG tablet TAKE 1 TABLET (200 MG TOTAL) BY MOUTH 2 TIMES DAILY.    Marland Kitchen lamiVUDine (EPIVIR) 150 MG tablet Take 150 mg by mouth daily.     . phosphorus (K-PHOS-NEUTRAL) 155-852-130 MG tablet Take 500 mg by mouth.    . predniSONE (DELTASONE) 5 MG tablet Take 5 mg by mouth daily.     . raltegravir (ISENTRESS) 400 MG tablet TAKE 1 TABLET (400 MG TOTAL) BY MOUTH 2 TIMES DAILY.    . SENSIPAR 30 MG tablet     . sulfamethoxazole-trimethoprim (BACTRIM DS) 800-160 MG per tablet Take 1 tablet by mouth 3 (three) times a week.    . tacrolimus (PROGRAF) 1 MG capsule Take 5 mg by mouth.    . Vitamin D, Ergocalciferol, (DRISDOL) 50000 units CAPS capsule 1 (ONE) CAPSULE BY MOUTH TWICE PER WEEK  5  . zolpidem (AMBIEN) 10 MG tablet Take 10 mg by mouth at bedtime as needed. Reported on 10/30/2015    . hydrALAZINE (APRESOLINE) 50 MG tablet Take 50 mg by mouth daily. Reported on 03/05/2016    . metoprolol  (LOPRESSOR) 100 MG tablet Take 50 mg by mouth 2 (two) times daily. Reported on 03/05/2016     No current facility-administered medications on file prior to visit.    Allergies  Allergen Reactions  . Ciprofloxacin Hives  . Vicodin [Hydrocodone-Acetaminophen] Other (See Comments)    Delusions     Family History  Problem Relation Age of Onset  . Diabetes Father   . Colon cancer Neg Hx   . Colon polyps Neg Hx   . Esophageal cancer Neg Hx   . Rectal cancer Neg Hx   . Stomach cancer Neg Hx     BP 134/80 mmHg  Pulse 92  Temp(Src) 98.5 F (36.9 C) (Oral)  Ht 5' 6.5" (1.689 m)  Wt 188 lb (85.276 kg)  BMI 29.89 kg/m2  SpO2 95%  Review of Systems denies weight loss, blurry vision, headache, chest pain, n/v, muscle cramps, excessive diaphoresis, depression, cold intolerance, rhinorrhea, and easy bruising.   She has intermittent doe and easy bruising.      Objective:   Physical Exam VS: see vs page GEN: no distress HEAD: head: no deformity eyes: no periorbital swelling, no proptosis external nose and ears are normal mouth: no lesion seen NECK: supple, thyroid is not enlarged CHEST WALL: no deformity LUNGS: clear to auscultation CV: reg rate and rhythm, no murmur ABD: abdomen is soft, nontender.  no hepatosplenomegaly.  not distended.  no hernia MUSCULOSKELETAL: muscle bulk and strength are grossly normal.  no obvious joint swelling.  gait is normal and steady EXTEMITIES: no deformity.  no ulcer on the feet.  feet are of normal color and temp.  no edema PULSES: dorsalis pedis intact bilat.  no carotid bruit NEURO:  cn 2-12 grossly intact.   readily moves all 4's.  sensation is intact to touch on the feet SKIN:  Normal texture and temperature.  No rash or suspicious lesion is visible.   NODES:  None palpable at the neck PSYCH: alert, well-oriented.  Does not appear anxious nor depressed.   i personally reviewed electrocardiogram tracing (07/01/12): Indication:  preop Impression: LVH and SB  Lab Results  Component Value Date   HGBA1C 6.4 03/05/2016   I have reviewed outside records, and summarized: Pt was noted to have severe hyperglycemia in early 2017, and was started on insulin.  Steroids were tapered.  She is  also on rx for HIV.   abd CT (2013): pancreas is normal.      Assessment & Plan:  DM, due to steroids, improved with reduced dosage.   Renal transplantation. She will prob have prednisone reduced to 5 mg qd, so she may need to have insulin further reduced.  It is also possible that steroids need to be increased again.    Patient is advised the following: Patient Instructions  good diet and exercise significantly improve the control of your diabetes.  please let me know if you wish to be referred to a dietician.  high blood sugar is very risky to your health.  you should see an eye doctor and dentist every year.  It is very important to get all recommended vaccinations.  controlling your blood pressure and cholesterol drastically reduces the damage diabetes does to your body.  Those who smoke should quit.  please discuss these with your doctor.  check your blood sugar once a day.  vary the time of day when you check, between before the 3 meals, and at bedtime.  also check if you have symptoms of your blood sugar being too high or too low.  please keep a record of the readings and bring it to your next appointment here (or you can bring the meter itself).  You can write it on any piece of paper.  please call us sooner if your blood sugar goes below 70, or if you have a lot of readings over 200. At our office, we are fortunate to have two specialists who are happy to help you:   Leonia Reader, RN, CDE, is a diabetes educator and pump trainer.  She is here on Monday mornings, and all day Tuesday and Wednesday.  She is can help you with low blood sugar avoidance and treatment, injecting insulin, sick day management, and others.   Antonieta Iba, RD  is our dietician.  She is here all day Thursday and Friday.  She can advise you about a healthy diet.  She can also help you about a variety of special diabetes situations, such as shift work, Actor, gluten-free, diet for kidney patients, traveling with diabetes, and help for those who need to gain weight.   Although your blood sugar is better with reduced prednisone, it could increase again, because just the passage of time also increases it.  For now, please reduce the levemir to 5 units at bedtime, and: Please continue the same novolog. Please come back for a follow-up appointment in 3 months.   Please call sooner if the steroids are increased again.

## 2016-03-05 NOTE — Patient Instructions (Addendum)
good diet and exercise significantly improve the control of your diabetes.  please let me know if you wish to be referred to a dietician.  high blood sugar is very risky to your health.  you should see an eye doctor and dentist every year.  It is very important to get all recommended vaccinations.  controlling your blood pressure and cholesterol drastically reduces the damage diabetes does to your body.  Those who smoke should quit.  please discuss these with your doctor.  check your blood sugar once a day.  vary the time of day when you check, between before the 3 meals, and at bedtime.  also check if you have symptoms of your blood sugar being too high or too low.  please keep a record of the readings and bring it to your next appointment here (or you can bring the meter itself).  You can write it on any piece of paper.  please call us sooner if your blood sugar goes below 70, or if you have a lot of readings over 200. At our office, we are fortunate to have two specialists who are happy to help you:   Leonia Reader, RN, CDE, is a diabetes educator and pump trainer.  She is here on Monday mornings, and all day Tuesday and Wednesday.  She is can help you with low blood sugar avoidance and treatment, injecting insulin, sick day management, and others.   Antonieta Iba, RD is our dietician.  She is here all day Thursday and Friday.  She can advise you about a healthy diet.  She can also help you about a variety of special diabetes situations, such as shift work, Actor, gluten-free, diet for kidney patients, traveling with diabetes, and help for those who need to gain weight.   Although your blood sugar is better with reduced prednisone, it could increase again, because just the passage of time also increases it.  For now, please reduce the levemir to 5 units at bedtime, and: Please continue the same novolog. Please come back for a follow-up appointment in 3 months.   Please call sooner if the  steroids are increased again.

## 2016-03-19 ENCOUNTER — Ambulatory Visit (INDEPENDENT_AMBULATORY_CARE_PROVIDER_SITE_OTHER): Payer: Medicare Other | Admitting: Internal Medicine

## 2016-03-19 ENCOUNTER — Encounter: Payer: Self-pay | Admitting: Internal Medicine

## 2016-03-19 VITALS — BP 114/58 | HR 89 | Temp 98.1°F | Resp 16 | Wt 189.0 lb

## 2016-03-19 DIAGNOSIS — F419 Anxiety disorder, unspecified: Secondary | ICD-10-CM

## 2016-03-19 DIAGNOSIS — B20 Human immunodeficiency virus [HIV] disease: Secondary | ICD-10-CM | POA: Diagnosis not present

## 2016-03-19 DIAGNOSIS — M87051 Idiopathic aseptic necrosis of right femur: Secondary | ICD-10-CM

## 2016-03-19 DIAGNOSIS — Z794 Long term (current) use of insulin: Secondary | ICD-10-CM

## 2016-03-19 DIAGNOSIS — G479 Sleep disorder, unspecified: Secondary | ICD-10-CM | POA: Diagnosis not present

## 2016-03-19 DIAGNOSIS — E119 Type 2 diabetes mellitus without complications: Secondary | ICD-10-CM

## 2016-03-19 DIAGNOSIS — M109 Gout, unspecified: Secondary | ICD-10-CM

## 2016-03-19 DIAGNOSIS — N186 End stage renal disease: Secondary | ICD-10-CM

## 2016-03-19 MED ORDER — ALPRAZOLAM 0.25 MG PO TABS
0.2500 mg | ORAL_TABLET | Freq: Every evening | ORAL | Status: DC | PRN
Start: 2016-03-19 — End: 2017-08-04

## 2016-03-19 NOTE — Progress Notes (Signed)
Pre visit review using our clinic review tool, if applicable. No additional management support is needed unless otherwise documented below in the visit note. 

## 2016-03-19 NOTE — Assessment & Plan Note (Signed)
Does not want to see ortho at this time since she has seen ortho previously - will monitor for now

## 2016-03-19 NOTE — Assessment & Plan Note (Signed)
Management per dr Loanne Drilling

## 2016-03-19 NOTE — Assessment & Plan Note (Signed)
S/p renal transplant Following with transplant team

## 2016-03-19 NOTE — Assessment & Plan Note (Signed)
Related to prednisone Uses xanax as needed - not nightly Will refill - use only as needed

## 2016-03-19 NOTE — Progress Notes (Signed)
Subjective:    Patient ID: Krista Bean, female    DOB: 08/13/1966, 50 y.o.   MRN: [id number]  HPI She is here to establish with a new pcp.    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 She is following with infectious disease at [facility]. She is taking her medication daily and her counts have been excellent.  She found out she had end stage renal disease 2005.  She had a renal transplant and lost that one   She had another transplant in 2016.  About one month ago she found out she is a diabetic due to the prednisone.   She is following closely with the transplant team at [facility].  She does not sleep well and shakes a lot which is related to the prednisone.   She also has some anxiety and occasional panic attacks.   She has been taking Xanax as needed for sleep and she does not take this daily. Her specialist do not want to prescribe and she would like to get that from me.  She does not want to take anything on a regular basis.  Avascular necrosis of right hip;   She has seen orthopedics in the past she did do some scraping of her hip. They advised that she will eventually need a hip replacement. The hip does cause some pain on occasion, but it is not daily.  Diabetes: She has seen Dr Loanne Drilling.  She is taking her medication daily as prescribed. She is fairly compliant with a diabetic diet. She is exercising regularly. She monitors her sugars and they have been running 134. She checks her feet daily and denies foot lesions. She is up-to-date with an ophthalmology examination.     Medications and allergies reviewed with patient and updated if appropriate.  Patient Active Problem List   Diagnosis Date Noted  . End stage renal disease ([place]) 06/29/2012  . HYPERGLYCEMIA 03/22/2008  . KIDNEY TRANSPLANTATION, HX OF 12/08/2007  . PERIPHERAL NEUROPATHY, FEET 12/28/2006  . RENAL FAILURE 07/31/2006  . HIV DISEASE 07/21/2006    Current Outpatient Prescriptions on File Prior to  Visit  Medication Sig Dispense Refill  . abacavir (ZIAGEN) 300 MG tablet Take 300 mg by mouth 2 (two) times daily. Reported on 03/05/2016    . allopurinol (ZYLOPRIM) 100 MG tablet Take 200 mg by mouth 2 (two) times daily as needed. For gout pain    . ALPRAZolam (XANAX) 0.25 MG tablet Take 0.25 mg by mouth at bedtime as needed. Reported on 10/30/2015    . aspirin EC 81 MG tablet Take 81 mg by mouth.    . calcium acetate (PHOSLO) 667 MG capsule Take 2 capsules by mouth Three times a day after meals.    . cinacalcet (SENSIPAR) 30 MG tablet Take by mouth.    . furosemide (LASIX) 20 MG tablet Reported on 10/30/2015    . hydrALAZINE (APRESOLINE) 50 MG tablet Take 50 mg by mouth daily. Reported on 03/05/2016    . insulin aspart (NOVOLOG) 100 UNIT/ML injection Inject 5 Units into the skin 3 (three) times daily before meals.     . insulin detemir (LEVEMIR) 100 UNIT/ML injection Inject 5 Units into the skin at bedtime.     Marland Kitchen labetalol (NORMODYNE) 200 MG tablet TAKE 1 TABLET (200 MG TOTAL) BY MOUTH 2 TIMES DAILY.    Marland Kitchen lamiVUDine (EPIVIR) 150 MG tablet Take 150 mg by mouth daily.     . metoprolol (LOPRESSOR) 100  MG tablet Take 50 mg by mouth 2 (two) times daily. Reported on 03/05/2016    . phosphorus (K-PHOS-NEUTRAL) 155-852-130 MG tablet Take 500 mg by mouth.    . predniSONE (DELTASONE) 5 MG tablet Take 5 mg by mouth daily.     . raltegravir (ISENTRESS) 400 MG tablet TAKE 1 TABLET (400 MG TOTAL) BY MOUTH 2 TIMES DAILY.    . SENSIPAR 30 MG tablet     . sulfamethoxazole-trimethoprim (BACTRIM DS) 800-160 MG per tablet Take 1 tablet by mouth 3 (three) times a week.    . tacrolimus (PROGRAF) 1 MG capsule Take 5 mg by mouth.    . Vitamin D, Ergocalciferol, (DRISDOL) 50000 units CAPS capsule 1 (ONE) CAPSULE BY MOUTH TWICE PER WEEK  5  . zolpidem (AMBIEN) 10 MG tablet Take 10 mg by mouth at bedtime as needed. Reported on 10/30/2015     No current facility-administered medications on file prior to visit.    Past  Medical History  Diagnosis Date  . HIV infection ([place])   . Peripheral neuropathy (HCC)     Feet  . Hyperglycemia   . Renal failure     Hx. of Kidney transplantation  . ESRD (end stage renal disease) ([place])   . Allergy     seasonal  . Heart murmur   . Blood transfusion without reported diagnosis     Past Surgical History  Procedure Laterality Date  . Transposition avf  Feb. 22, 2011    Right  by Dr. Rosalia Hammers  . Kidney transplant Left 05-2014  . Shuntogram N/A 07/01/2012    Procedure: Earney Mallet;  Surgeon: Conrad [place], MD;  Location: [facility] [place] CATH LAB;  Service: Cardiovascular;  Laterality: N/A;  . Tubal ligation  1993  . Hemorroidectomy      unsure of year but thinks 32  . Kidney transplant Right 2010  . Breast reduction surgery      Social History   Social History  . Marital Status: Divorced    Spouse Name: N/A  . Number of Children: N/A  . Years of Education: N/A   Social History Main Topics  . Smoking status: Never Smoker   . Smokeless tobacco: Never Used  . Alcohol Use: No  . Drug Use: No  . Sexual Activity: Not Asked   Other Topics Concern  . None   Social History Narrative    Family History  Problem Relation Age of Onset  . Diabetes Father   . Colon cancer Neg Hx   . Colon polyps Neg Hx   . Esophageal cancer Neg Hx   . Rectal cancer Neg Hx   . Stomach cancer Neg Hx     Review of Systems  Constitutional: Negative for fever, chills and fatigue.       Hot flashes  Eyes: Negative for visual disturbance.  Respiratory: Positive for shortness of breath (with exertion at times). Negative for cough and wheezing.   Cardiovascular: Negative for chest pain, palpitations and leg swelling.  Gastrointestinal: Negative for nausea, abdominal pain, diarrhea, constipation and blood in stool.       No gerd  Genitourinary: Negative for dysuria, hematuria and difficulty urinating.  Musculoskeletal: Positive for arthralgias (right hip pain - avascular necrosis, finger  cramping).  Neurological: Negative for dizziness, light-headedness and numbness.  Psychiatric/Behavioral: Negative for dysphoric mood. The patient is nervous/anxious.        Objective:   Filed Vitals:   03/19/16 0958  BP: 114/58  Pulse: 89  Temp: 98.1  F (36.7 C)  Resp: 16   Filed Weights   03/19/16 0958  Weight: 189 lb (85.73 kg)   Body mass index is 30.05 kg/(m^2).   Physical Exam Constitutional: She appears well-developed and well-nourished. No distress.  HENT:  Head: Normocephalic and atraumatic.  Right Ear: External ear normal. Normal ear canal and TM Left Ear: External ear normal.  Normal ear canal and TM Mouth/Throat: Oropharynx is clear and moist.  Eyes: Conjunctivae normal.  Neck: Neck supple. No tracheal deviation present. No thyromegaly present.  No carotid bruit  Cardiovascular: Normal rate, regular rhythm and normal heart sounds.   No murmur heard.  No edema. Pulmonary/Chest: Effort normal and breath sounds normal. No respiratory distress. She has no wheezes. She has no rales.  Abdominal: Soft. She exhibits no distension. There is no tenderness.  Lymphadenopathy: She has no cervical adenopathy.  Skin: Skin is warm and dry. She is not diaphoretic. AV fistula right arm Psychiatric: She has a normal mood and affect. Her behavior is normal.         Assessment & Plan:   See Problem List for Assessment and Plan of chronic medical problems.  Follow-up annually, sooner if needed

## 2016-03-19 NOTE — Assessment & Plan Note (Signed)
Xanax as needed. °

## 2016-03-19 NOTE — Patient Instructions (Signed)
.    No immunizations administered today.   Medications reviewed and updated.  No changes recommended at this time.  Your prescription(s) have been submitted to your pharmacy. Please take as directed and contact our office if you believe you are having problem(s) with the medication(s).   Please followup in one year, sooner if needed

## 2016-03-26 ENCOUNTER — Telehealth: Payer: Self-pay | Admitting: Endocrinology

## 2016-03-26 MED ORDER — INSULIN LISPRO 100 UNIT/ML (KWIKPEN): 5.0000 [IU] | PEN_INJECTOR | Freq: Three times a day (TID) | SUBCUTANEOUS | Status: DC

## 2016-03-26 NOTE — Telephone Encounter (Signed)
See note below and please advise,

## 2016-03-26 NOTE — Telephone Encounter (Signed)
Nurse from [facility] called asking to get a prescription for patient for the humalog kwikpen, send to Fax [phone]

## 2016-03-26 NOTE — Telephone Encounter (Signed)
Rx for humalog sent.

## 2016-03-26 NOTE — Telephone Encounter (Signed)
ok to refill both insulins prn

## 2016-04-11 ENCOUNTER — Telehealth: Payer: Self-pay | Admitting: Endocrinology

## 2016-04-11 MED ORDER — ONETOUCH ULTRA MINI W/DEVICE KIT: PACK | Status: DC

## 2016-04-11 NOTE — Telephone Encounter (Addendum)
Rx resubmitted with the dx code. Pt notified.

## 2016-04-11 NOTE — Telephone Encounter (Signed)
PT is out of town and has lost her meter, she needs to know if it is possible to call her in a new meter. [facility]: [place], [phone]

## 2016-04-11 NOTE — Telephone Encounter (Signed)
[facility] called about the Rx for the meter, they need the diagnosis code on the prescription for insurance.

## 2016-04-11 NOTE — Telephone Encounter (Signed)
Rx for the meter submitted [facility] to [facility] as the number listed below. Pt notified and voiced understanding.

## 2016-06-05 ENCOUNTER — Encounter: Payer: Self-pay | Admitting: Endocrinology

## 2016-06-05 ENCOUNTER — Ambulatory Visit (INDEPENDENT_AMBULATORY_CARE_PROVIDER_SITE_OTHER): Payer: Medicare Other | Admitting: Endocrinology

## 2016-06-05 VITALS — BP 124/80 | HR 87 | Wt 184.0 lb

## 2016-06-05 DIAGNOSIS — E1100 Type 2 diabetes mellitus with hyperosmolarity without nonketotic hyperglycemic-hyperosmolar coma (NKHHC): Secondary | ICD-10-CM | POA: Diagnosis not present

## 2016-06-05 LAB — POCT GLYCOSYLATED HEMOGLOBIN (HGB A1C): HEMOGLOBIN A1C: 5.9

## 2016-06-05 MED ORDER — INSULIN LISPRO 100 UNIT/ML (KWIKPEN): 3.0000 [IU] | PEN_INJECTOR | Freq: Three times a day (TID) | SUBCUTANEOUS | 5 refills | Status: DC

## 2016-06-05 NOTE — Patient Instructions (Addendum)
Although your blood sugar is better with reduced prednisone, it could increase again, because just the passage of time also increases it.  check your blood sugar twice a day.  vary the time of day when you check, between before the 3 meals, and at bedtime.  also check if you have symptoms of your blood sugar being too high or too low.  please keep a record of the readings and bring it to your next appointment here (or you can bring the meter itself).  You can write it on any piece of paper.  please call us sooner if your blood sugar goes below 70, or if you have a lot of readings over 200. For now, please stop taking the levemir and: reduce the novolog to 3 units 3 times a day (just before each meal).   Please take this not matter what your blood sugar is. Please come back for a follow-up appointment in 2 months.   Please call sooner if the steroids are increased again.

## 2016-06-05 NOTE — Progress Notes (Signed)
Subjective:    Patient ID: Krista Bean, female    DOB: 1966/10/12, 50 y.o.   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  There is no trend throughout the day.  She says she often skips the insulin, due to not needing it.  She takes prednisone, 10 mg QD.   Past Medical History:  Diagnosis Date  . Allergy    seasonal  . Blood transfusion without reported diagnosis   . ESRD (end stage renal disease) ([place])   . Heart murmur   . HIV infection ([place])   . Hyperglycemia   . Peripheral neuropathy (H[facility])    Feet  . Renal failure    Hx. of Kidney transplantation    Past Surgical History:  Procedure Laterality Date  . BREAST REDUCTION SURGERY    . HEMORROIDECTOMY     unsure of year but thinks 25  . KIDNEY TRANSPLANT Left 05-2014  . KIDNEY TRANSPLANT Right 2010  . SHUNTOGRAM N/A 07/01/2012   Procedure: Earney Mallet;  Surgeon: Conrad [place], MD;  Location: [facility] CATH LAB;  Service: Cardiovascular;  Laterality: N/A;  . Transposition AVF  Feb. 22, 2011   Right  by Dr. Rosalia Hammers  . TUBAL LIGATION  1993    Social History   Social History  . Marital status: Divorced    Spouse name: N/A  . Number of children: N/A  . Years of education: N/A   Occupational History  . Not on file.   Social History Main Topics  . Smoking status: Never Smoker  . Smokeless tobacco: Never Used  . Alcohol use No  . Drug use: No  . Sexual activity: Not on file   Other Topics Concern  . Not on file   Social History Narrative  . No narrative on file    Current Outpatient Prescriptions on File Prior to Visit  Medication Sig Dispense Refill  . abacavir (ZIAGEN) 300 MG tablet  Take 300 mg by mouth 2 (two) times daily. Reported on 03/05/2016    . ALPRAZolam (XANAX) 0.25 MG tablet Take 1 tablet (0.25 mg total) by mouth at bedtime as needed. Reported on 10/30/2015 30 tablet 2  . aspirin EC 81 MG tablet Take 81 mg by mouth.    . Blood Glucose Monitoring Suppl (ONE TOUCH ULTRA MINI) w/Device KIT Use to check blood sugar 1 time per day. Dx Code: E11.9 1 each 0  . calcium acetate (PHOSLO) 667 MG capsule Take 2 capsules by mouth Three times a day after meals.    . cinacalcet (SENSIPAR) 30 MG tablet Take by mouth.    . furosemide (LASIX) 20 MG tablet Reported on 10/30/2015    . hydrALAZINE (APRESOLINE) 50 MG tablet Take 50 mg by mouth daily. Reported on 03/05/2016    . insulin aspart (NOVOLOG) 100 UNIT/ML injection Inject 5 Units into the skin 3 (three) times daily before meals.     Marland Kitchen labetalol (NORMODYNE) 200 MG tablet TAKE 1 TABLET (200 MG TOTAL) BY MOUTH 2 TIMES DAILY.    Marland Kitchen lamiVUDine (EPIVIR) 150 MG tablet Take 150 mg by mouth daily.     . metoprolol (LOPRESSOR) 100 MG tablet Take 50 mg by mouth 2 (two) times  daily. Reported on 03/05/2016    . mirabegron ER (MYRBETRIQ) 50 MG TB24 tablet Take 50 mg by mouth daily.    . phosphorus (K-PHOS-NEUTRAL) 155-852-130 MG tablet Take 500 mg by mouth.    . predniSONE (DELTASONE) 5 MG tablet Take 5 mg by mouth daily.     . raltegravir (ISENTRESS) 400 MG tablet TAKE 1 TABLET (400 MG TOTAL) BY MOUTH 2 TIMES DAILY.    . SENSIPAR 30 MG tablet     . sulfamethoxazole-trimethoprim (BACTRIM DS) 800-160 MG per tablet Take 1 tablet by mouth daily.    . tacrolimus (PROGRAF) 1 MG capsule Take 5 mg by mouth.    . Vitamin D, Ergocalciferol, (DRISDOL) 50000 units CAPS capsule 1 (ONE) CAPSULE BY MOUTH TWICE PER WEEK  5   No current facility-administered medications on file prior to visit.     Allergies  Allergen Reactions  . Ciprofloxacin Hives  . Vicodin [Hydrocodone-Acetaminophen] Other (See Comments)    Delusions     Family History  Problem  Relation Age of Onset  . Diabetes Father   . Colon cancer Neg Hx   . Colon polyps Neg Hx   . Esophageal cancer Neg Hx   . Rectal cancer Neg Hx   . Stomach cancer Neg Hx     BP 124/80   Pulse 87   Wt 184 lb (83.5 kg)   BMI 29.25 kg/m   Review of Systems She denies hypoglycemia.      Objective:   Physical Exam VITAL SIGNS:  See vs page GENERAL: no distress Pulses: dorsalis pedis intact bilat.   MSK: no deformity of the feet. CV: no leg edema.  Skin:  no ulcer on the feet.  normal color and temp on the feet. Neuro: sensation is intact to touch on the feet.     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 ESRD: in this setting, she doesn't need basal insulin.  cbg pattern confirms this.   Transplantation: prednisone dosage will vary.

## 2016-06-19 DIAGNOSIS — Z794 Long term (current) use of insulin: Secondary | ICD-10-CM | POA: Diagnosis not present

## 2016-06-19 DIAGNOSIS — M87051 Idiopathic aseptic necrosis of right femur: Secondary | ICD-10-CM | POA: Diagnosis not present

## 2016-06-19 DIAGNOSIS — E118 Type 2 diabetes mellitus with unspecified complications: Secondary | ICD-10-CM | POA: Diagnosis not present

## 2016-06-19 DIAGNOSIS — Z940 Kidney transplant status: Secondary | ICD-10-CM | POA: Diagnosis not present

## 2016-06-19 DIAGNOSIS — T8611 Kidney transplant rejection: Secondary | ICD-10-CM | POA: Diagnosis not present

## 2016-06-19 DIAGNOSIS — D899 Disorder involving the immune mechanism, unspecified: Secondary | ICD-10-CM | POA: Diagnosis not present

## 2016-06-19 DIAGNOSIS — I10 Essential (primary) hypertension: Secondary | ICD-10-CM | POA: Diagnosis not present

## 2016-06-20 DIAGNOSIS — M87051 Idiopathic aseptic necrosis of right femur: Secondary | ICD-10-CM | POA: Diagnosis not present

## 2016-06-20 DIAGNOSIS — B961 Klebsiella pneumoniae [K. pneumoniae] as the cause of diseases classified elsewhere: Secondary | ICD-10-CM | POA: Diagnosis not present

## 2016-06-20 DIAGNOSIS — T8611 Kidney transplant rejection: Secondary | ICD-10-CM | POA: Diagnosis not present

## 2016-06-20 DIAGNOSIS — Z794 Long term (current) use of insulin: Secondary | ICD-10-CM | POA: Diagnosis not present

## 2016-06-20 DIAGNOSIS — Z940 Kidney transplant status: Secondary | ICD-10-CM | POA: Diagnosis not present

## 2016-06-20 DIAGNOSIS — N390 Urinary tract infection, site not specified: Secondary | ICD-10-CM | POA: Diagnosis not present

## 2016-06-20 DIAGNOSIS — I10 Essential (primary) hypertension: Secondary | ICD-10-CM | POA: Diagnosis not present

## 2016-06-20 DIAGNOSIS — D899 Disorder involving the immune mechanism, unspecified: Secondary | ICD-10-CM | POA: Diagnosis not present

## 2016-06-20 DIAGNOSIS — E118 Type 2 diabetes mellitus with unspecified complications: Secondary | ICD-10-CM | POA: Diagnosis not present

## 2016-07-30 DIAGNOSIS — M87052 Idiopathic aseptic necrosis of left femur: Secondary | ICD-10-CM | POA: Diagnosis not present

## 2016-07-30 DIAGNOSIS — M1611 Unilateral primary osteoarthritis, right hip: Secondary | ICD-10-CM | POA: Diagnosis not present

## 2016-07-30 DIAGNOSIS — Z940 Kidney transplant status: Secondary | ICD-10-CM | POA: Diagnosis not present

## 2016-07-30 DIAGNOSIS — E119 Type 2 diabetes mellitus without complications: Secondary | ICD-10-CM | POA: Diagnosis not present

## 2016-07-30 DIAGNOSIS — M8700 Idiopathic aseptic necrosis of unspecified bone: Secondary | ICD-10-CM | POA: Diagnosis not present

## 2016-07-30 DIAGNOSIS — M87051 Idiopathic aseptic necrosis of right femur: Secondary | ICD-10-CM | POA: Diagnosis not present

## 2016-07-30 DIAGNOSIS — M2580 Other specified joint disorders, unspecified joint: Secondary | ICD-10-CM | POA: Diagnosis not present

## 2016-08-01 DIAGNOSIS — E1122 Type 2 diabetes mellitus with diabetic chronic kidney disease: Secondary | ICD-10-CM | POA: Diagnosis not present

## 2016-08-01 DIAGNOSIS — M879 Osteonecrosis, unspecified: Secondary | ICD-10-CM | POA: Diagnosis not present

## 2016-08-01 DIAGNOSIS — M109 Gout, unspecified: Secondary | ICD-10-CM | POA: Diagnosis not present

## 2016-08-01 DIAGNOSIS — E785 Hyperlipidemia, unspecified: Secondary | ICD-10-CM | POA: Diagnosis not present

## 2016-08-01 DIAGNOSIS — Z23 Encounter for immunization: Secondary | ICD-10-CM | POA: Diagnosis not present

## 2016-08-01 DIAGNOSIS — Z794 Long term (current) use of insulin: Secondary | ICD-10-CM | POA: Diagnosis not present

## 2016-08-01 DIAGNOSIS — Z940 Kidney transplant status: Secondary | ICD-10-CM | POA: Diagnosis not present

## 2016-08-01 DIAGNOSIS — N390 Urinary tract infection, site not specified: Secondary | ICD-10-CM | POA: Diagnosis not present

## 2016-08-01 DIAGNOSIS — I272 Pulmonary hypertension, unspecified: Secondary | ICD-10-CM | POA: Diagnosis not present

## 2016-08-01 DIAGNOSIS — N186 End stage renal disease: Secondary | ICD-10-CM | POA: Diagnosis not present

## 2016-08-01 DIAGNOSIS — I120 Hypertensive chronic kidney disease with stage 5 chronic kidney disease or end stage renal disease: Secondary | ICD-10-CM | POA: Diagnosis not present

## 2016-08-01 DIAGNOSIS — E872 Acidosis: Secondary | ICD-10-CM | POA: Diagnosis not present

## 2016-08-01 DIAGNOSIS — D631 Anemia in chronic kidney disease: Secondary | ICD-10-CM | POA: Diagnosis not present

## 2016-08-01 DIAGNOSIS — D899 Disorder involving the immune mechanism, unspecified: Secondary | ICD-10-CM | POA: Diagnosis not present

## 2016-08-01 DIAGNOSIS — M87051 Idiopathic aseptic necrosis of right femur: Secondary | ICD-10-CM | POA: Diagnosis not present

## 2016-08-01 DIAGNOSIS — Z7982 Long term (current) use of aspirin: Secondary | ICD-10-CM | POA: Diagnosis not present

## 2016-08-05 ENCOUNTER — Ambulatory Visit (INDEPENDENT_AMBULATORY_CARE_PROVIDER_SITE_OTHER): Payer: Medicare Other | Admitting: Endocrinology

## 2016-08-05 VITALS — BP 126/72 | HR 81 | Ht 66.5 in | Wt 185.0 lb

## 2016-08-05 DIAGNOSIS — M87051 Idiopathic aseptic necrosis of right femur: Secondary | ICD-10-CM

## 2016-08-05 DIAGNOSIS — E1100 Type 2 diabetes mellitus with hyperosmolarity without nonketotic hyperglycemic-hyperosmolar coma (NKHHC): Secondary | ICD-10-CM

## 2016-08-05 LAB — POCT GLYCOSYLATED HEMOGLOBIN (HGB A1C): HEMOGLOBIN A1C: 5.9

## 2016-08-05 MED ORDER — INSULIN LISPRO 100 UNIT/ML (KWIKPEN): 7.0000 [IU] | PEN_INJECTOR | Freq: Three times a day (TID) | SUBCUTANEOUS | 5 refills | Status: DC

## 2016-08-05 MED ORDER — GLUCOSE BLOOD VI STRP: 1.0000 | ORAL_STRIP | Freq: Two times a day (BID) | 12 refills | Status: DC

## 2016-08-05 NOTE — Progress Notes (Signed)
Subjective:    Patient ID: Krista Bean, female    DOB: 12/12/1965, 50 y.o.   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  There is no trend throughout the day.  She says she skips the insulin approx once per week, due to not needing it.  She takes prednisone, 7.5 mg QD.  She takes novolog, 10 units 3 times a day (just before each meal).  She was advised to had bilat THR for AVN, but wants to get done closer to home.  Past Medical History:  Diagnosis Date  . Allergy    seasonal  . Blood transfusion without reported diagnosis   . ESRD (end stage renal disease) ([place])   . Heart murmur   . HIV infection ([place])   . Hyperglycemia   . Peripheral neuropathy (HCC)    Feet  . Renal failure    Hx. of Kidney transplantation    Past Surgical History:  Procedure Laterality Date  . BREAST REDUCTION SURGERY    . HEMORROIDECTOMY     unsure of year but thinks 103  . KIDNEY TRANSPLANT Left 05-2014  . KIDNEY TRANSPLANT Right 2010  . SHUNTOGRAM N/A 07/01/2012   Procedure: Earney Mallet;  Surgeon: Conrad [place], MD;  Location: [facility] CATH LAB;  Service: Cardiovascular;  Laterality: N/A;  . Transposition AVF  Feb. 22, 2011   Right  by Dr. Rosalia Hammers  . TUBAL LIGATION  1993    Social History   Social History  . Marital status: Divorced    Spouse name: N/A  . Number of children: N/A  . Years of education: N/A   Occupational History  . Not on file.   Social History Main Topics  . Smoking status: Never Smoker  . Smokeless tobacco: Never Used  . Alcohol use No  . Drug use: No  . Sexual activity: Not on file   Other Topics Concern  . Not on file   Social  History Narrative  . No narrative on file    Current Outpatient Prescriptions on File Prior to Visit  Medication Sig Dispense Refill  . abacavir (ZIAGEN) 300 MG tablet Take 300 mg by mouth 2 (two) times daily. Reported on 03/05/2016    . ALPRAZolam (XANAX) 0.25 MG tablet Take 1 tablet (0.25 mg total) by mouth at bedtime as needed. Reported on 10/30/2015 30 tablet 2  . aspirin EC 81 MG tablet Take 81 mg by mouth.    . calcium acetate (PHOSLO) 667 MG capsule Take 2 capsules by mouth Three times a day after meals.    . furosemide (LASIX) 20 MG tablet Reported on 10/30/2015    . hydrALAZINE (APRESOLINE) 50 MG tablet Take 50 mg by mouth daily. Reported on 03/05/2016    . labetalol (NORMODYNE) 200 MG tablet TAKE 1 TABLET (200 MG TOTAL) BY MOUTH 2 TIMES DAILY.    Marland Kitchen lamiVUDine (EPIVIR) 150 MG tablet Take 150 mg by mouth daily.     . metoprolol (LOPRESSOR) 100 MG tablet Take 50 mg by mouth 2 (two) times daily. Reported on 03/05/2016    . mirabegron ER (MYRBETRIQ) 50 MG TB24 tablet Take 50 mg by mouth daily.    . phosphorus (K-PHOS-NEUTRAL)  [phone] MG tablet Take 500 mg by mouth.    . predniSONE (DELTASONE) 5 MG tablet Take 5 mg by mouth daily.     . raltegravir (ISENTRESS) 400 MG tablet TAKE 1 TABLET (400 MG TOTAL) BY MOUTH 2 TIMES DAILY.    . SENSIPAR 30 MG tablet     . sulfamethoxazole-trimethoprim (BACTRIM DS) 800-160 MG per tablet Take 1 tablet by mouth daily.    . tacrolimus (PROGRAF) 1 MG capsule Take 5 mg by mouth.     No current facility-administered medications on file prior to visit.     Allergies  Allergen Reactions  . Ciprofloxacin Hives  . Vicodin [Hydrocodone-Acetaminophen] Other (See Comments)    Delusions     Family History  Problem Relation Age of Onset  . Diabetes Father   . Colon cancer Neg Hx   . Colon polyps Neg Hx   . Esophageal cancer Neg Hx   . Rectal cancer Neg Hx   . Stomach cancer Neg Hx     BP 126/72   Pulse 81   Ht 5' 6.5" (1.689 m)   Wt 185 lb (83.9  kg)   SpO2 98%   BMI 29.41 kg/m    Review of Systems She denies hypoglycemia.     Objective:   Physical Exam VITAL SIGNS:  See vs page GENERAL: no distress Pulses: dorsalis pedis intact bilat.   MSK: no deformity of the feet CV: no leg edema Skin:  no ulcer on the feet.  normal color and temp on the feet. 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   AVN, new to me.  Patient is advised the following: Patient Instructions  Although your blood sugar is better with reduced prednisone, it could increase again, because just the passage of time also increases it.  check your blood sugar twice a day.  vary the time of day when you check, between before the 3 meals, and at bedtime.  also check if you have symptoms of your blood sugar being too high or too low.  please keep a record of the readings and bring it to your next appointment here (or you can bring the meter itself).  You can write it on any piece of paper.  please call us sooner if your blood sugar goes below 70, or if you have a lot of readings over 200. reduce the novolog to 7 units 3 times a day (just before each meal).   Please take this not matter what your blood sugar is.  Please come back for a follow-up appointment in 3 months.   Please see an orthopedic specialist.  you will receive a phone call, about a day and time for an appointment Please call sooner if the steroids are increased again.

## 2016-08-05 NOTE — Patient Instructions (Addendum)
Although your blood sugar is better with reduced prednisone, it could increase again, because just the passage of time also increases it.  check your blood sugar twice a day.  vary the time of day when you check, between before the 3 meals, and at bedtime.  also check if you have symptoms of your blood sugar being too high or too low.  please keep a record of the readings and bring it to your next appointment here (or you can bring the meter itself).  You can write it on any piece of paper.  please call us sooner if your blood sugar goes below 70, or if you have a lot of readings over 200. reduce the novolog to 7 units 3 times a day (just before each meal).   Please take this not matter what your blood sugar is.  Please come back for a follow-up appointment in 3 months.   Please see an orthopedic specialist.  you will receive a phone call, about a day and time for an appointment Please call sooner if the steroids are increased again.

## 2016-08-18 DIAGNOSIS — Z87440 Personal history of urinary (tract) infections: Secondary | ICD-10-CM | POA: Diagnosis not present

## 2016-08-18 DIAGNOSIS — E785 Hyperlipidemia, unspecified: Secondary | ICD-10-CM | POA: Diagnosis not present

## 2016-08-18 DIAGNOSIS — Z4822 Encounter for aftercare following kidney transplant: Secondary | ICD-10-CM | POA: Diagnosis not present

## 2016-08-18 DIAGNOSIS — Z79899 Other long term (current) drug therapy: Secondary | ICD-10-CM | POA: Diagnosis not present

## 2016-08-18 DIAGNOSIS — T8611 Kidney transplant rejection: Secondary | ICD-10-CM | POA: Diagnosis not present

## 2016-08-18 DIAGNOSIS — Z794 Long term (current) use of insulin: Secondary | ICD-10-CM | POA: Diagnosis not present

## 2016-08-18 DIAGNOSIS — Z940 Kidney transplant status: Secondary | ICD-10-CM | POA: Diagnosis not present

## 2016-08-18 DIAGNOSIS — I120 Hypertensive chronic kidney disease with stage 5 chronic kidney disease or end stage renal disease: Secondary | ICD-10-CM | POA: Diagnosis not present

## 2016-08-18 DIAGNOSIS — I272 Pulmonary hypertension, unspecified: Secondary | ICD-10-CM | POA: Diagnosis not present

## 2016-08-18 DIAGNOSIS — Z7982 Long term (current) use of aspirin: Secondary | ICD-10-CM | POA: Diagnosis not present

## 2016-08-18 DIAGNOSIS — T8612 Kidney transplant failure: Secondary | ICD-10-CM | POA: Diagnosis not present

## 2016-08-18 DIAGNOSIS — D72818 Other decreased white blood cell count: Secondary | ICD-10-CM | POA: Diagnosis not present

## 2016-08-18 DIAGNOSIS — M87051 Idiopathic aseptic necrosis of right femur: Secondary | ICD-10-CM | POA: Diagnosis not present

## 2016-08-18 DIAGNOSIS — N186 End stage renal disease: Secondary | ICD-10-CM | POA: Diagnosis not present

## 2016-08-18 DIAGNOSIS — E1122 Type 2 diabetes mellitus with diabetic chronic kidney disease: Secondary | ICD-10-CM | POA: Diagnosis not present

## 2016-08-25 DIAGNOSIS — Z7982 Long term (current) use of aspirin: Secondary | ICD-10-CM | POA: Diagnosis not present

## 2016-08-25 DIAGNOSIS — E872 Acidosis: Secondary | ICD-10-CM | POA: Diagnosis not present

## 2016-08-25 DIAGNOSIS — E119 Type 2 diabetes mellitus without complications: Secondary | ICD-10-CM | POA: Diagnosis not present

## 2016-08-25 DIAGNOSIS — I10 Essential (primary) hypertension: Secondary | ICD-10-CM | POA: Diagnosis not present

## 2016-08-25 DIAGNOSIS — M25559 Pain in unspecified hip: Secondary | ICD-10-CM | POA: Diagnosis not present

## 2016-08-25 DIAGNOSIS — Z794 Long term (current) use of insulin: Secondary | ICD-10-CM | POA: Diagnosis not present

## 2016-08-25 DIAGNOSIS — Z940 Kidney transplant status: Secondary | ICD-10-CM | POA: Diagnosis not present

## 2016-08-25 DIAGNOSIS — T8610 Unspecified complication of kidney transplant: Secondary | ICD-10-CM | POA: Diagnosis not present

## 2016-08-25 DIAGNOSIS — Z5181 Encounter for therapeutic drug level monitoring: Secondary | ICD-10-CM | POA: Diagnosis not present

## 2016-08-25 DIAGNOSIS — Z4822 Encounter for aftercare following kidney transplant: Secondary | ICD-10-CM | POA: Diagnosis not present

## 2016-08-25 DIAGNOSIS — Z79899 Other long term (current) drug therapy: Secondary | ICD-10-CM | POA: Diagnosis not present

## 2016-08-25 DIAGNOSIS — D899 Disorder involving the immune mechanism, unspecified: Secondary | ICD-10-CM | POA: Diagnosis not present

## 2016-08-25 DIAGNOSIS — R7989 Other specified abnormal findings of blood chemistry: Secondary | ICD-10-CM | POA: Diagnosis not present

## 2016-08-26 DIAGNOSIS — E872 Acidosis: Secondary | ICD-10-CM | POA: Diagnosis not present

## 2016-08-26 DIAGNOSIS — Z794 Long term (current) use of insulin: Secondary | ICD-10-CM | POA: Diagnosis not present

## 2016-08-26 DIAGNOSIS — Z940 Kidney transplant status: Secondary | ICD-10-CM | POA: Diagnosis not present

## 2016-08-26 DIAGNOSIS — I10 Essential (primary) hypertension: Secondary | ICD-10-CM | POA: Diagnosis not present

## 2016-08-26 DIAGNOSIS — Z79899 Other long term (current) drug therapy: Secondary | ICD-10-CM | POA: Diagnosis not present

## 2016-08-26 DIAGNOSIS — E119 Type 2 diabetes mellitus without complications: Secondary | ICD-10-CM | POA: Diagnosis not present

## 2016-08-26 DIAGNOSIS — M25559 Pain in unspecified hip: Secondary | ICD-10-CM | POA: Diagnosis not present

## 2016-08-26 DIAGNOSIS — R7989 Other specified abnormal findings of blood chemistry: Secondary | ICD-10-CM | POA: Diagnosis not present

## 2016-08-26 DIAGNOSIS — Z7982 Long term (current) use of aspirin: Secondary | ICD-10-CM | POA: Diagnosis not present

## 2016-09-03 DIAGNOSIS — I120 Hypertensive chronic kidney disease with stage 5 chronic kidney disease or end stage renal disease: Secondary | ICD-10-CM | POA: Diagnosis not present

## 2016-09-03 DIAGNOSIS — Z4822 Encounter for aftercare following kidney transplant: Secondary | ICD-10-CM | POA: Diagnosis not present

## 2016-09-03 DIAGNOSIS — E785 Hyperlipidemia, unspecified: Secondary | ICD-10-CM | POA: Diagnosis not present

## 2016-09-03 DIAGNOSIS — E119 Type 2 diabetes mellitus without complications: Secondary | ICD-10-CM | POA: Diagnosis not present

## 2016-09-03 DIAGNOSIS — Z79899 Other long term (current) drug therapy: Secondary | ICD-10-CM | POA: Diagnosis not present

## 2016-09-03 DIAGNOSIS — Z794 Long term (current) use of insulin: Secondary | ICD-10-CM | POA: Diagnosis not present

## 2016-09-03 DIAGNOSIS — D631 Anemia in chronic kidney disease: Secondary | ICD-10-CM | POA: Diagnosis not present

## 2016-09-03 DIAGNOSIS — Z7982 Long term (current) use of aspirin: Secondary | ICD-10-CM | POA: Diagnosis not present

## 2016-09-03 DIAGNOSIS — E872 Acidosis: Secondary | ICD-10-CM | POA: Diagnosis not present

## 2016-09-03 DIAGNOSIS — M87851 Other osteonecrosis, right femur: Secondary | ICD-10-CM | POA: Diagnosis not present

## 2016-09-03 DIAGNOSIS — M87852 Other osteonecrosis, left femur: Secondary | ICD-10-CM | POA: Diagnosis not present

## 2016-09-03 DIAGNOSIS — Z940 Kidney transplant status: Secondary | ICD-10-CM | POA: Diagnosis not present

## 2016-09-03 DIAGNOSIS — N186 End stage renal disease: Secondary | ICD-10-CM | POA: Diagnosis not present

## 2016-09-03 DIAGNOSIS — D8989 Other specified disorders involving the immune mechanism, not elsewhere classified: Secondary | ICD-10-CM | POA: Diagnosis not present

## 2016-09-03 DIAGNOSIS — Z9889 Other specified postprocedural states: Secondary | ICD-10-CM | POA: Diagnosis not present

## 2016-09-03 DIAGNOSIS — I272 Pulmonary hypertension, unspecified: Secondary | ICD-10-CM | POA: Diagnosis not present

## 2016-09-03 DIAGNOSIS — Z87440 Personal history of urinary (tract) infections: Secondary | ICD-10-CM | POA: Diagnosis not present

## 2016-09-03 DIAGNOSIS — Z792 Long term (current) use of antibiotics: Secondary | ICD-10-CM | POA: Diagnosis not present

## 2016-09-03 DIAGNOSIS — E1122 Type 2 diabetes mellitus with diabetic chronic kidney disease: Secondary | ICD-10-CM | POA: Diagnosis not present

## 2016-09-03 DIAGNOSIS — D72819 Decreased white blood cell count, unspecified: Secondary | ICD-10-CM | POA: Diagnosis not present

## 2016-09-11 DIAGNOSIS — Z01419 Encounter for gynecological examination (general) (routine) without abnormal findings: Secondary | ICD-10-CM | POA: Diagnosis not present

## 2016-09-11 DIAGNOSIS — Z1231 Encounter for screening mammogram for malignant neoplasm of breast: Secondary | ICD-10-CM | POA: Diagnosis not present

## 2016-09-19 DIAGNOSIS — D631 Anemia in chronic kidney disease: Secondary | ICD-10-CM | POA: Diagnosis not present

## 2016-09-19 DIAGNOSIS — Z7689 Persons encountering health services in other specified circumstances: Secondary | ICD-10-CM | POA: Diagnosis not present

## 2016-09-19 DIAGNOSIS — Z940 Kidney transplant status: Secondary | ICD-10-CM | POA: Diagnosis not present

## 2016-09-23 DIAGNOSIS — R87612 Low grade squamous intraepithelial lesion on cytologic smear of cervix (LGSIL): Secondary | ICD-10-CM | POA: Diagnosis not present

## 2016-09-23 DIAGNOSIS — N879 Dysplasia of cervix uteri, unspecified: Secondary | ICD-10-CM | POA: Diagnosis not present

## 2016-09-29 DIAGNOSIS — E119 Type 2 diabetes mellitus without complications: Secondary | ICD-10-CM | POA: Diagnosis not present

## 2016-09-29 DIAGNOSIS — H52223 Regular astigmatism, bilateral: Secondary | ICD-10-CM | POA: Diagnosis not present

## 2016-10-01 DIAGNOSIS — E785 Hyperlipidemia, unspecified: Secondary | ICD-10-CM | POA: Diagnosis not present

## 2016-10-01 DIAGNOSIS — Z4822 Encounter for aftercare following kidney transplant: Secondary | ICD-10-CM | POA: Diagnosis not present

## 2016-10-01 DIAGNOSIS — N186 End stage renal disease: Secondary | ICD-10-CM | POA: Diagnosis not present

## 2016-10-01 DIAGNOSIS — Z992 Dependence on renal dialysis: Secondary | ICD-10-CM | POA: Diagnosis not present

## 2016-10-01 DIAGNOSIS — D631 Anemia in chronic kidney disease: Secondary | ICD-10-CM | POA: Diagnosis not present

## 2016-10-01 DIAGNOSIS — Z940 Kidney transplant status: Secondary | ICD-10-CM | POA: Diagnosis not present

## 2016-10-01 DIAGNOSIS — Z79899 Other long term (current) drug therapy: Secondary | ICD-10-CM | POA: Diagnosis not present

## 2016-10-01 DIAGNOSIS — I10 Essential (primary) hypertension: Secondary | ICD-10-CM | POA: Diagnosis not present

## 2016-10-01 DIAGNOSIS — Z7982 Long term (current) use of aspirin: Secondary | ICD-10-CM | POA: Diagnosis not present

## 2016-10-01 DIAGNOSIS — E1122 Type 2 diabetes mellitus with diabetic chronic kidney disease: Secondary | ICD-10-CM | POA: Diagnosis not present

## 2016-10-01 DIAGNOSIS — I120 Hypertensive chronic kidney disease with stage 5 chronic kidney disease or end stage renal disease: Secondary | ICD-10-CM | POA: Diagnosis not present

## 2016-10-01 DIAGNOSIS — D8989 Other specified disorders involving the immune mechanism, not elsewhere classified: Secondary | ICD-10-CM | POA: Diagnosis not present

## 2016-10-01 DIAGNOSIS — Z794 Long term (current) use of insulin: Secondary | ICD-10-CM | POA: Diagnosis not present

## 2016-10-01 DIAGNOSIS — M879 Osteonecrosis, unspecified: Secondary | ICD-10-CM | POA: Diagnosis not present

## 2016-10-09 DIAGNOSIS — Z940 Kidney transplant status: Secondary | ICD-10-CM | POA: Diagnosis not present

## 2016-10-10 DIAGNOSIS — Z940 Kidney transplant status: Secondary | ICD-10-CM | POA: Diagnosis not present

## 2016-10-14 DIAGNOSIS — Z940 Kidney transplant status: Secondary | ICD-10-CM | POA: Diagnosis not present

## 2016-11-02 NOTE — Progress Notes (Signed)
Subjective:    Patient ID: Krista Bean, female    DOB: 1966-07-17, 51 y.o.   MRN: [id number]  HPI Pt returns for f/u of diabetes mellitus: DM type: due to steroids Dx'ed: 7782 Complications: polyneuropathy and ESRD (transplant in 2015, but ESRD has recurred) Therapy: insulin since dx GDM: never DKA: never Severe hypoglycemia: never Pancreatitis: never Other: she takes multiple daily injections Interval history: no cbg record, but states cbg's are in the low to mid-100's.  She takes novolog, 3 units 3 times a day (just before each meal).   Past Medical History:  Diagnosis Date  . Allergy    seasonal  . Blood transfusion without reported diagnosis   . ESRD (end stage renal disease) ([place])   . Heart murmur   . HIV infection ([place])   . Hyperglycemia   . Peripheral neuropathy ([facility])    Feet  . Renal failure    Hx. of Kidney transplantation    Past Surgical History:  Procedure Laterality Date  . BREAST REDUCTION SURGERY    . HEMORROIDECTOMY     unsure of year but thinks 1  . KIDNEY TRANSPLANT Left 05-2014  . KIDNEY TRANSPLANT Right 2010  . SHUNTOGRAM N/A 07/01/2012   Procedure: Earney Mallet;  Surgeon: Conrad Afton, MD;  Location: [facility] CATH LAB;  Service: Cardiovascular;  Laterality: N/A;  . Transposition AVF  Feb. 22, 2011   Right  by Dr. Rosalia Hammers  . TUBAL LIGATION  1993    Social History   Social History  . Marital status: Divorced    Spouse name: N/A  . Number of children: N/A  . Years of education: N/A   Occupational History  . Not on file.   Social History Main Topics  . Smoking status: Never Smoker  . Smokeless tobacco: Never Used  . Alcohol use No  . Drug use: No  . Sexual activity: Not on file   Other Topics Concern  . Not on file   Social History Narrative  . No narrative on file    Current Outpatient Prescriptions on File Prior to Visit  Medication Sig Dispense Refill  . abacavir (ZIAGEN) 300 MG tablet Take 300 mg by mouth 2 (two) times  daily. Reported on 03/05/2016    . ALPRAZolam (XANAX) 0.25 MG tablet Take 1 tablet (0.25 mg total) by mouth at bedtime as needed. Reported on 10/30/2015 30 tablet 2  . aspirin EC 81 MG tablet Take 81 mg by mouth.    . calcium acetate (PHOSLO) 667 MG capsule Take 2 capsules by mouth Three times a day after meals.    . furosemide (LASIX) 20 MG tablet Reported on 10/30/2015    . glucose blood (ONE TOUCH ULTRA TEST) test strip 1 each by Other route 2 (two) times daily. And lancets 2/day 100 each 12  . hydrALAZINE (APRESOLINE) 50 MG tablet Take 50 mg by mouth daily. Reported on 03/05/2016    . labetalol (NORMODYNE) 200 MG tablet TAKE 1 TABLET (200 MG TOTAL) BY MOUTH 2 TIMES DAILY.    Marland Kitchen lamiVUDine (EPIVIR) 150 MG tablet Take 150 mg by mouth daily.     . metoprolol (LOPRESSOR) 100 MG tablet Take 50 mg by mouth 2 (two) times daily. Reported on 03/05/2016    . mirabegron ER (MYRBETRIQ) 50 MG TB24 tablet Take 50 mg by mouth daily.    . phosphorus (K-PHOS-NEUTRAL) 155-852-130 MG tablet Take 500 mg by mouth.    . predniSONE (DELTASONE) 5 MG tablet Take 5  mg by mouth daily.     . raltegravir (ISENTRESS) 400 MG tablet TAKE 1 TABLET (400 MG TOTAL) BY MOUTH 2 TIMES DAILY.    Marland Kitchen sulfamethoxazole-trimethoprim (BACTRIM DS) 800-160 MG per tablet Take 1 tablet by mouth daily.    . tacrolimus (PROGRAF) 1 MG capsule Take 5 mg by mouth.    . SENSIPAR 30 MG tablet      No current facility-administered medications on file prior to visit.     Allergies  Allergen Reactions  . Ciprofloxacin Hives  . Vicodin [Hydrocodone-Acetaminophen] Other (See Comments)    Delusions     Family History  Problem Relation Age of Onset  . Diabetes Father   . Colon cancer Neg Hx   . Colon polyps Neg Hx   . Esophageal cancer Neg Hx   . Rectal cancer Neg Hx   . Stomach cancer Neg Hx     BP 110/78   Pulse 77   Ht 5' 6.5" (1.689 m)   Wt 185 lb (83.9 kg)   SpO2 98%   BMI 29.41 kg/m     Review of Systems She denies  hypoglycemia.    Objective:   Physical Exam VITAL SIGNS:  See vs page GENERAL: no distress Pulses: dorsalis pedis intact bilat.   MSK: no deformity of the feet CV: no leg edema Skin:  no ulcer on the feet.  normal color and temp on the feet.  Neuro: sensation is intact to touch on the feet.    Lab Results  Component Value Date   CREATININE 5.20 (H) 01/12/2016   BUN 41 (H) 01/12/2016   NA 138 01/12/2016   K 3.8 01/12/2016   CL 114 (H) 01/12/2016   CO2 12 (L) 04/13/2012   A1c=5.9%     Assessment & Plan:  Insulin-requiring type 2 DM, with polyneuropathy, overcontrolled.  Renal failure: in this setting, he does not need basal insulin, but causes contraindication to multiple oral rx.  As renal failure gets worse, need for DM medication may go away. Transplantation: reduction of steroid rx may also caused reduced insulin rx.  Patient is advised the following: Patient Instructions  Although your blood sugar is better with reduced prednisone, it could increase again, because just the passage of time also increases it.  For now, please reduce the humalog to 2 units with breakfast and supper.   check your blood sugar twice a day.  vary the time of day when you check, between before the 3 meals, and at bedtime.  also check if you have symptoms of your blood sugar being too high or too low.  please keep a record of the readings and bring it to your next appointment here (or you can bring the meter itself).  You can write it on any piece of paper.  please call us sooner if your blood sugar goes below 70, or if you have a lot of readings over 200. reduce the novolog to 7 units 3 times a day (just before each meal).   Please take this not matter what your blood sugar is.  Please come back for a follow-up appointment in 3 months.   Please call sooner if the steroids are increased again.

## 2016-11-04 DIAGNOSIS — Z940 Kidney transplant status: Secondary | ICD-10-CM | POA: Diagnosis not present

## 2016-11-04 DIAGNOSIS — N390 Urinary tract infection, site not specified: Secondary | ICD-10-CM | POA: Diagnosis not present

## 2016-11-05 ENCOUNTER — Encounter: Payer: Self-pay | Admitting: Endocrinology

## 2016-11-05 ENCOUNTER — Ambulatory Visit (INDEPENDENT_AMBULATORY_CARE_PROVIDER_SITE_OTHER): Payer: Medicare Other | Admitting: Endocrinology

## 2016-11-05 VITALS — BP 110/78 | HR 77 | Ht 66.5 in | Wt 185.0 lb

## 2016-11-05 DIAGNOSIS — Z940 Kidney transplant status: Secondary | ICD-10-CM | POA: Diagnosis not present

## 2016-11-05 DIAGNOSIS — Z791 Long term (current) use of non-steroidal anti-inflammatories (NSAID): Secondary | ICD-10-CM | POA: Diagnosis not present

## 2016-11-05 DIAGNOSIS — E1100 Type 2 diabetes mellitus with hyperosmolarity without nonketotic hyperglycemic-hyperosmolar coma (NKHHC): Secondary | ICD-10-CM

## 2016-11-05 DIAGNOSIS — E1121 Type 2 diabetes mellitus with diabetic nephropathy: Secondary | ICD-10-CM | POA: Diagnosis not present

## 2016-11-05 DIAGNOSIS — M109 Gout, unspecified: Secondary | ICD-10-CM | POA: Diagnosis not present

## 2016-11-05 DIAGNOSIS — Z794 Long term (current) use of insulin: Secondary | ICD-10-CM | POA: Diagnosis not present

## 2016-11-05 DIAGNOSIS — I120 Hypertensive chronic kidney disease with stage 5 chronic kidney disease or end stage renal disease: Secondary | ICD-10-CM | POA: Diagnosis not present

## 2016-11-05 DIAGNOSIS — M87051 Idiopathic aseptic necrosis of right femur: Secondary | ICD-10-CM | POA: Diagnosis not present

## 2016-11-05 DIAGNOSIS — M879 Osteonecrosis, unspecified: Secondary | ICD-10-CM | POA: Diagnosis not present

## 2016-11-05 DIAGNOSIS — Z881 Allergy status to other antibiotic agents status: Secondary | ICD-10-CM | POA: Diagnosis not present

## 2016-11-05 DIAGNOSIS — E785 Hyperlipidemia, unspecified: Secondary | ICD-10-CM | POA: Diagnosis not present

## 2016-11-05 DIAGNOSIS — N186 End stage renal disease: Secondary | ICD-10-CM | POA: Diagnosis not present

## 2016-11-05 DIAGNOSIS — Z7982 Long term (current) use of aspirin: Secondary | ICD-10-CM | POA: Diagnosis not present

## 2016-11-05 DIAGNOSIS — D631 Anemia in chronic kidney disease: Secondary | ICD-10-CM | POA: Diagnosis not present

## 2016-11-05 DIAGNOSIS — Z79899 Other long term (current) drug therapy: Secondary | ICD-10-CM | POA: Diagnosis not present

## 2016-11-05 DIAGNOSIS — E1122 Type 2 diabetes mellitus with diabetic chronic kidney disease: Secondary | ICD-10-CM | POA: Diagnosis not present

## 2016-11-05 DIAGNOSIS — Z885 Allergy status to narcotic agent status: Secondary | ICD-10-CM | POA: Diagnosis not present

## 2016-11-05 LAB — POCT GLYCOSYLATED HEMOGLOBIN (HGB A1C): HEMOGLOBIN A1C: 5.9

## 2016-11-05 MED ORDER — INSULIN LISPRO 100 UNIT/ML (KWIKPEN): 2.0000 [IU] | PEN_INJECTOR | Freq: Two times a day (BID) | SUBCUTANEOUS | 5 refills | Status: DC

## 2016-11-05 NOTE — Patient Instructions (Addendum)
Although your blood sugar is better with reduced prednisone, it could increase again, because just the passage of time also increases it.  For now, please reduce the humalog to 2 units with breakfast and supper.   check your blood sugar twice a day.  vary the time of day when you check, between before the 3 meals, and at bedtime.  also check if you have symptoms of your blood sugar being too high or too low.  please keep a record of the readings and bring it to your next appointment here (or you can bring the meter itself).  You can write it on any piece of paper.  please call us sooner if your blood sugar goes below 70, or if you have a lot of readings over 200. reduce the novolog to 7 units 3 times a day (just before each meal).   Please take this not matter what your blood sugar is.  Please come back for a follow-up appointment in 3 months.   Please call sooner if the steroids are increased again.

## 2016-11-10 DIAGNOSIS — Z940 Kidney transplant status: Secondary | ICD-10-CM | POA: Diagnosis not present

## 2016-11-10 DIAGNOSIS — I129 Hypertensive chronic kidney disease with stage 1 through stage 4 chronic kidney disease, or unspecified chronic kidney disease: Secondary | ICD-10-CM | POA: Diagnosis not present

## 2016-11-10 DIAGNOSIS — D631 Anemia in chronic kidney disease: Secondary | ICD-10-CM | POA: Diagnosis not present

## 2016-11-10 DIAGNOSIS — E119 Type 2 diabetes mellitus without complications: Secondary | ICD-10-CM | POA: Diagnosis not present

## 2016-11-10 DIAGNOSIS — N189 Chronic kidney disease, unspecified: Secondary | ICD-10-CM | POA: Diagnosis not present

## 2016-11-11 DIAGNOSIS — I272 Pulmonary hypertension, unspecified: Secondary | ICD-10-CM | POA: Diagnosis not present

## 2016-11-11 DIAGNOSIS — I129 Hypertensive chronic kidney disease with stage 1 through stage 4 chronic kidney disease, or unspecified chronic kidney disease: Secondary | ICD-10-CM | POA: Diagnosis not present

## 2016-11-11 DIAGNOSIS — E119 Type 2 diabetes mellitus without complications: Secondary | ICD-10-CM | POA: Diagnosis not present

## 2016-11-11 DIAGNOSIS — Z79899 Other long term (current) drug therapy: Secondary | ICD-10-CM | POA: Diagnosis not present

## 2016-11-11 DIAGNOSIS — N184 Chronic kidney disease, stage 4 (severe): Secondary | ICD-10-CM | POA: Diagnosis not present

## 2016-11-11 DIAGNOSIS — M87051 Idiopathic aseptic necrosis of right femur: Secondary | ICD-10-CM | POA: Diagnosis not present

## 2016-11-11 DIAGNOSIS — Z940 Kidney transplant status: Secondary | ICD-10-CM | POA: Diagnosis not present

## 2016-11-11 DIAGNOSIS — E872 Acidosis: Secondary | ICD-10-CM | POA: Diagnosis not present

## 2016-11-11 DIAGNOSIS — D696 Thrombocytopenia, unspecified: Secondary | ICD-10-CM | POA: Diagnosis not present

## 2016-11-11 DIAGNOSIS — R200 Anesthesia of skin: Secondary | ICD-10-CM | POA: Diagnosis not present

## 2016-11-11 DIAGNOSIS — Z794 Long term (current) use of insulin: Secondary | ICD-10-CM | POA: Diagnosis not present

## 2016-11-11 DIAGNOSIS — E785 Hyperlipidemia, unspecified: Secondary | ICD-10-CM | POA: Diagnosis not present

## 2016-11-12 DIAGNOSIS — M8700 Idiopathic aseptic necrosis of unspecified bone: Secondary | ICD-10-CM | POA: Diagnosis not present

## 2016-11-13 HISTORY — PX: TOTAL HIP ARTHROPLASTY: SHX124

## 2016-11-17 DIAGNOSIS — I499 Cardiac arrhythmia, unspecified: Secondary | ICD-10-CM | POA: Diagnosis not present

## 2016-11-17 DIAGNOSIS — Z940 Kidney transplant status: Secondary | ICD-10-CM | POA: Diagnosis not present

## 2016-11-17 DIAGNOSIS — I10 Essential (primary) hypertension: Secondary | ICD-10-CM | POA: Diagnosis not present

## 2016-11-17 DIAGNOSIS — I129 Hypertensive chronic kidney disease with stage 1 through stage 4 chronic kidney disease, or unspecified chronic kidney disease: Secondary | ICD-10-CM | POA: Diagnosis not present

## 2016-11-17 DIAGNOSIS — N184 Chronic kidney disease, stage 4 (severe): Secondary | ICD-10-CM | POA: Diagnosis not present

## 2016-11-17 DIAGNOSIS — M879 Osteonecrosis, unspecified: Secondary | ICD-10-CM | POA: Diagnosis not present

## 2016-11-17 DIAGNOSIS — E785 Hyperlipidemia, unspecified: Secondary | ICD-10-CM | POA: Diagnosis not present

## 2016-11-17 DIAGNOSIS — R200 Anesthesia of skin: Secondary | ICD-10-CM | POA: Diagnosis not present

## 2016-11-17 DIAGNOSIS — Z79899 Other long term (current) drug therapy: Secondary | ICD-10-CM | POA: Diagnosis not present

## 2016-11-17 DIAGNOSIS — M1611 Unilateral primary osteoarthritis, right hip: Secondary | ICD-10-CM | POA: Diagnosis not present

## 2016-11-17 DIAGNOSIS — I272 Pulmonary hypertension, unspecified: Secondary | ICD-10-CM | POA: Diagnosis not present

## 2016-11-17 DIAGNOSIS — Z471 Aftercare following joint replacement surgery: Secondary | ICD-10-CM | POA: Diagnosis not present

## 2016-11-17 DIAGNOSIS — D696 Thrombocytopenia, unspecified: Secondary | ICD-10-CM | POA: Diagnosis not present

## 2016-11-17 DIAGNOSIS — N186 End stage renal disease: Secondary | ICD-10-CM | POA: Diagnosis not present

## 2016-11-17 DIAGNOSIS — I120 Hypertensive chronic kidney disease with stage 5 chronic kidney disease or end stage renal disease: Secondary | ICD-10-CM | POA: Diagnosis not present

## 2016-11-17 DIAGNOSIS — E1122 Type 2 diabetes mellitus with diabetic chronic kidney disease: Secondary | ICD-10-CM | POA: Diagnosis not present

## 2016-11-17 DIAGNOSIS — M87051 Idiopathic aseptic necrosis of right femur: Secondary | ICD-10-CM | POA: Diagnosis not present

## 2016-11-17 DIAGNOSIS — E119 Type 2 diabetes mellitus without complications: Secondary | ICD-10-CM | POA: Diagnosis not present

## 2016-11-17 DIAGNOSIS — G8918 Other acute postprocedural pain: Secondary | ICD-10-CM | POA: Diagnosis not present

## 2016-11-17 DIAGNOSIS — Z794 Long term (current) use of insulin: Secondary | ICD-10-CM | POA: Diagnosis not present

## 2016-11-17 DIAGNOSIS — E872 Acidosis: Secondary | ICD-10-CM | POA: Diagnosis not present

## 2016-11-17 DIAGNOSIS — M25551 Pain in right hip: Secondary | ICD-10-CM | POA: Diagnosis not present

## 2016-11-17 DIAGNOSIS — Z96641 Presence of right artificial hip joint: Secondary | ICD-10-CM | POA: Diagnosis not present

## 2016-11-20 DIAGNOSIS — I10 Essential (primary) hypertension: Secondary | ICD-10-CM | POA: Diagnosis not present

## 2016-11-20 DIAGNOSIS — Z7952 Long term (current) use of systemic steroids: Secondary | ICD-10-CM | POA: Diagnosis not present

## 2016-11-20 DIAGNOSIS — Z794 Long term (current) use of insulin: Secondary | ICD-10-CM | POA: Diagnosis not present

## 2016-11-20 DIAGNOSIS — Z7982 Long term (current) use of aspirin: Secondary | ICD-10-CM | POA: Diagnosis not present

## 2016-11-20 DIAGNOSIS — Z792 Long term (current) use of antibiotics: Secondary | ICD-10-CM | POA: Diagnosis not present

## 2016-11-20 DIAGNOSIS — E785 Hyperlipidemia, unspecified: Secondary | ICD-10-CM | POA: Diagnosis not present

## 2016-11-20 DIAGNOSIS — Z96641 Presence of right artificial hip joint: Secondary | ICD-10-CM | POA: Diagnosis not present

## 2016-11-20 DIAGNOSIS — I272 Pulmonary hypertension, unspecified: Secondary | ICD-10-CM | POA: Diagnosis not present

## 2016-11-20 DIAGNOSIS — E119 Type 2 diabetes mellitus without complications: Secondary | ICD-10-CM | POA: Diagnosis not present

## 2016-11-20 DIAGNOSIS — Z940 Kidney transplant status: Secondary | ICD-10-CM | POA: Diagnosis not present

## 2016-11-20 DIAGNOSIS — Z471 Aftercare following joint replacement surgery: Secondary | ICD-10-CM | POA: Diagnosis not present

## 2016-11-20 DIAGNOSIS — Z7901 Long term (current) use of anticoagulants: Secondary | ICD-10-CM | POA: Diagnosis not present

## 2016-11-21 DIAGNOSIS — Z792 Long term (current) use of antibiotics: Secondary | ICD-10-CM | POA: Diagnosis not present

## 2016-11-21 DIAGNOSIS — Z940 Kidney transplant status: Secondary | ICD-10-CM | POA: Diagnosis not present

## 2016-11-21 DIAGNOSIS — Z471 Aftercare following joint replacement surgery: Secondary | ICD-10-CM | POA: Diagnosis not present

## 2016-11-21 DIAGNOSIS — Z96641 Presence of right artificial hip joint: Secondary | ICD-10-CM | POA: Diagnosis not present

## 2016-11-21 DIAGNOSIS — I10 Essential (primary) hypertension: Secondary | ICD-10-CM | POA: Diagnosis not present

## 2016-11-21 DIAGNOSIS — Z7982 Long term (current) use of aspirin: Secondary | ICD-10-CM | POA: Diagnosis not present

## 2016-11-21 DIAGNOSIS — Z7952 Long term (current) use of systemic steroids: Secondary | ICD-10-CM | POA: Diagnosis not present

## 2016-11-21 DIAGNOSIS — I272 Pulmonary hypertension, unspecified: Secondary | ICD-10-CM | POA: Diagnosis not present

## 2016-11-21 DIAGNOSIS — Z794 Long term (current) use of insulin: Secondary | ICD-10-CM | POA: Diagnosis not present

## 2016-11-21 DIAGNOSIS — E785 Hyperlipidemia, unspecified: Secondary | ICD-10-CM | POA: Diagnosis not present

## 2016-11-21 DIAGNOSIS — Z7901 Long term (current) use of anticoagulants: Secondary | ICD-10-CM | POA: Diagnosis not present

## 2016-11-21 DIAGNOSIS — E119 Type 2 diabetes mellitus without complications: Secondary | ICD-10-CM | POA: Diagnosis not present

## 2016-11-24 DIAGNOSIS — I272 Pulmonary hypertension, unspecified: Secondary | ICD-10-CM | POA: Diagnosis not present

## 2016-11-24 DIAGNOSIS — I10 Essential (primary) hypertension: Secondary | ICD-10-CM | POA: Diagnosis not present

## 2016-11-24 DIAGNOSIS — Z792 Long term (current) use of antibiotics: Secondary | ICD-10-CM | POA: Diagnosis not present

## 2016-11-24 DIAGNOSIS — Z7901 Long term (current) use of anticoagulants: Secondary | ICD-10-CM | POA: Diagnosis not present

## 2016-11-24 DIAGNOSIS — Z96641 Presence of right artificial hip joint: Secondary | ICD-10-CM | POA: Diagnosis not present

## 2016-11-24 DIAGNOSIS — Z471 Aftercare following joint replacement surgery: Secondary | ICD-10-CM | POA: Diagnosis not present

## 2016-11-24 DIAGNOSIS — Z794 Long term (current) use of insulin: Secondary | ICD-10-CM | POA: Diagnosis not present

## 2016-11-24 DIAGNOSIS — E785 Hyperlipidemia, unspecified: Secondary | ICD-10-CM | POA: Diagnosis not present

## 2016-11-24 DIAGNOSIS — Z7952 Long term (current) use of systemic steroids: Secondary | ICD-10-CM | POA: Diagnosis not present

## 2016-11-24 DIAGNOSIS — Z940 Kidney transplant status: Secondary | ICD-10-CM | POA: Diagnosis not present

## 2016-11-24 DIAGNOSIS — Z7982 Long term (current) use of aspirin: Secondary | ICD-10-CM | POA: Diagnosis not present

## 2016-11-24 DIAGNOSIS — E119 Type 2 diabetes mellitus without complications: Secondary | ICD-10-CM | POA: Diagnosis not present

## 2016-11-26 DIAGNOSIS — I10 Essential (primary) hypertension: Secondary | ICD-10-CM | POA: Diagnosis not present

## 2016-11-26 DIAGNOSIS — Z7982 Long term (current) use of aspirin: Secondary | ICD-10-CM | POA: Diagnosis not present

## 2016-11-26 DIAGNOSIS — Z96641 Presence of right artificial hip joint: Secondary | ICD-10-CM | POA: Diagnosis not present

## 2016-11-26 DIAGNOSIS — Z471 Aftercare following joint replacement surgery: Secondary | ICD-10-CM | POA: Diagnosis not present

## 2016-11-26 DIAGNOSIS — Z794 Long term (current) use of insulin: Secondary | ICD-10-CM | POA: Diagnosis not present

## 2016-11-26 DIAGNOSIS — Z7952 Long term (current) use of systemic steroids: Secondary | ICD-10-CM | POA: Diagnosis not present

## 2016-11-26 DIAGNOSIS — I272 Pulmonary hypertension, unspecified: Secondary | ICD-10-CM | POA: Diagnosis not present

## 2016-11-26 DIAGNOSIS — Z792 Long term (current) use of antibiotics: Secondary | ICD-10-CM | POA: Diagnosis not present

## 2016-11-26 DIAGNOSIS — E785 Hyperlipidemia, unspecified: Secondary | ICD-10-CM | POA: Diagnosis not present

## 2016-11-26 DIAGNOSIS — Z7901 Long term (current) use of anticoagulants: Secondary | ICD-10-CM | POA: Diagnosis not present

## 2016-11-26 DIAGNOSIS — Z940 Kidney transplant status: Secondary | ICD-10-CM | POA: Diagnosis not present

## 2016-11-26 DIAGNOSIS — E119 Type 2 diabetes mellitus without complications: Secondary | ICD-10-CM | POA: Diagnosis not present

## 2016-11-27 DIAGNOSIS — Z471 Aftercare following joint replacement surgery: Secondary | ICD-10-CM | POA: Diagnosis not present

## 2016-11-27 DIAGNOSIS — Z792 Long term (current) use of antibiotics: Secondary | ICD-10-CM | POA: Diagnosis not present

## 2016-11-27 DIAGNOSIS — E119 Type 2 diabetes mellitus without complications: Secondary | ICD-10-CM | POA: Diagnosis not present

## 2016-11-27 DIAGNOSIS — Z940 Kidney transplant status: Secondary | ICD-10-CM | POA: Diagnosis not present

## 2016-11-27 DIAGNOSIS — E785 Hyperlipidemia, unspecified: Secondary | ICD-10-CM | POA: Diagnosis not present

## 2016-11-27 DIAGNOSIS — I272 Pulmonary hypertension, unspecified: Secondary | ICD-10-CM | POA: Diagnosis not present

## 2016-11-27 DIAGNOSIS — Z7952 Long term (current) use of systemic steroids: Secondary | ICD-10-CM | POA: Diagnosis not present

## 2016-11-27 DIAGNOSIS — Z7901 Long term (current) use of anticoagulants: Secondary | ICD-10-CM | POA: Diagnosis not present

## 2016-11-27 DIAGNOSIS — Z7982 Long term (current) use of aspirin: Secondary | ICD-10-CM | POA: Diagnosis not present

## 2016-11-27 DIAGNOSIS — Z96641 Presence of right artificial hip joint: Secondary | ICD-10-CM | POA: Diagnosis not present

## 2016-11-27 DIAGNOSIS — Z794 Long term (current) use of insulin: Secondary | ICD-10-CM | POA: Diagnosis not present

## 2016-11-27 DIAGNOSIS — I10 Essential (primary) hypertension: Secondary | ICD-10-CM | POA: Diagnosis not present

## 2016-11-28 DIAGNOSIS — Z471 Aftercare following joint replacement surgery: Secondary | ICD-10-CM | POA: Diagnosis not present

## 2016-11-28 DIAGNOSIS — Z96641 Presence of right artificial hip joint: Secondary | ICD-10-CM | POA: Diagnosis not present

## 2016-11-28 DIAGNOSIS — Z7982 Long term (current) use of aspirin: Secondary | ICD-10-CM | POA: Diagnosis not present

## 2016-11-28 DIAGNOSIS — E119 Type 2 diabetes mellitus without complications: Secondary | ICD-10-CM | POA: Diagnosis not present

## 2016-11-28 DIAGNOSIS — Z794 Long term (current) use of insulin: Secondary | ICD-10-CM | POA: Diagnosis not present

## 2016-11-28 DIAGNOSIS — I10 Essential (primary) hypertension: Secondary | ICD-10-CM | POA: Diagnosis not present

## 2016-11-28 DIAGNOSIS — Z7952 Long term (current) use of systemic steroids: Secondary | ICD-10-CM | POA: Diagnosis not present

## 2016-11-28 DIAGNOSIS — Z7901 Long term (current) use of anticoagulants: Secondary | ICD-10-CM | POA: Diagnosis not present

## 2016-11-28 DIAGNOSIS — Z940 Kidney transplant status: Secondary | ICD-10-CM | POA: Diagnosis not present

## 2016-11-28 DIAGNOSIS — E785 Hyperlipidemia, unspecified: Secondary | ICD-10-CM | POA: Diagnosis not present

## 2016-11-28 DIAGNOSIS — I272 Pulmonary hypertension, unspecified: Secondary | ICD-10-CM | POA: Diagnosis not present

## 2016-11-28 DIAGNOSIS — Z792 Long term (current) use of antibiotics: Secondary | ICD-10-CM | POA: Diagnosis not present

## 2016-12-01 DIAGNOSIS — I10 Essential (primary) hypertension: Secondary | ICD-10-CM | POA: Diagnosis not present

## 2016-12-01 DIAGNOSIS — Z96641 Presence of right artificial hip joint: Secondary | ICD-10-CM | POA: Diagnosis not present

## 2016-12-01 DIAGNOSIS — Z792 Long term (current) use of antibiotics: Secondary | ICD-10-CM | POA: Diagnosis not present

## 2016-12-01 DIAGNOSIS — Z7901 Long term (current) use of anticoagulants: Secondary | ICD-10-CM | POA: Diagnosis not present

## 2016-12-01 DIAGNOSIS — Z7982 Long term (current) use of aspirin: Secondary | ICD-10-CM | POA: Diagnosis not present

## 2016-12-01 DIAGNOSIS — Z940 Kidney transplant status: Secondary | ICD-10-CM | POA: Diagnosis not present

## 2016-12-01 DIAGNOSIS — Z794 Long term (current) use of insulin: Secondary | ICD-10-CM | POA: Diagnosis not present

## 2016-12-01 DIAGNOSIS — Z471 Aftercare following joint replacement surgery: Secondary | ICD-10-CM | POA: Diagnosis not present

## 2016-12-01 DIAGNOSIS — E119 Type 2 diabetes mellitus without complications: Secondary | ICD-10-CM | POA: Diagnosis not present

## 2016-12-01 DIAGNOSIS — Z7952 Long term (current) use of systemic steroids: Secondary | ICD-10-CM | POA: Diagnosis not present

## 2016-12-01 DIAGNOSIS — I272 Pulmonary hypertension, unspecified: Secondary | ICD-10-CM | POA: Diagnosis not present

## 2016-12-01 DIAGNOSIS — E785 Hyperlipidemia, unspecified: Secondary | ICD-10-CM | POA: Diagnosis not present

## 2016-12-02 DIAGNOSIS — Z471 Aftercare following joint replacement surgery: Secondary | ICD-10-CM | POA: Diagnosis not present

## 2016-12-02 DIAGNOSIS — Z96641 Presence of right artificial hip joint: Secondary | ICD-10-CM | POA: Diagnosis not present

## 2016-12-04 DIAGNOSIS — E785 Hyperlipidemia, unspecified: Secondary | ICD-10-CM | POA: Diagnosis not present

## 2016-12-04 DIAGNOSIS — Z7982 Long term (current) use of aspirin: Secondary | ICD-10-CM | POA: Diagnosis not present

## 2016-12-04 DIAGNOSIS — I10 Essential (primary) hypertension: Secondary | ICD-10-CM | POA: Diagnosis not present

## 2016-12-04 DIAGNOSIS — I272 Pulmonary hypertension, unspecified: Secondary | ICD-10-CM | POA: Diagnosis not present

## 2016-12-04 DIAGNOSIS — Z7952 Long term (current) use of systemic steroids: Secondary | ICD-10-CM | POA: Diagnosis not present

## 2016-12-04 DIAGNOSIS — Z794 Long term (current) use of insulin: Secondary | ICD-10-CM | POA: Diagnosis not present

## 2016-12-04 DIAGNOSIS — Z471 Aftercare following joint replacement surgery: Secondary | ICD-10-CM | POA: Diagnosis not present

## 2016-12-04 DIAGNOSIS — E119 Type 2 diabetes mellitus without complications: Secondary | ICD-10-CM | POA: Diagnosis not present

## 2016-12-04 DIAGNOSIS — Z7901 Long term (current) use of anticoagulants: Secondary | ICD-10-CM | POA: Diagnosis not present

## 2016-12-04 DIAGNOSIS — Z940 Kidney transplant status: Secondary | ICD-10-CM | POA: Diagnosis not present

## 2016-12-04 DIAGNOSIS — Z96641 Presence of right artificial hip joint: Secondary | ICD-10-CM | POA: Diagnosis not present

## 2016-12-04 DIAGNOSIS — Z792 Long term (current) use of antibiotics: Secondary | ICD-10-CM | POA: Diagnosis not present

## 2016-12-05 DIAGNOSIS — Z471 Aftercare following joint replacement surgery: Secondary | ICD-10-CM | POA: Diagnosis not present

## 2016-12-05 DIAGNOSIS — Z7982 Long term (current) use of aspirin: Secondary | ICD-10-CM | POA: Diagnosis not present

## 2016-12-05 DIAGNOSIS — Z7901 Long term (current) use of anticoagulants: Secondary | ICD-10-CM | POA: Diagnosis not present

## 2016-12-05 DIAGNOSIS — Z940 Kidney transplant status: Secondary | ICD-10-CM | POA: Diagnosis not present

## 2016-12-05 DIAGNOSIS — Z7952 Long term (current) use of systemic steroids: Secondary | ICD-10-CM | POA: Diagnosis not present

## 2016-12-05 DIAGNOSIS — I10 Essential (primary) hypertension: Secondary | ICD-10-CM | POA: Diagnosis not present

## 2016-12-05 DIAGNOSIS — E119 Type 2 diabetes mellitus without complications: Secondary | ICD-10-CM | POA: Diagnosis not present

## 2016-12-05 DIAGNOSIS — Z792 Long term (current) use of antibiotics: Secondary | ICD-10-CM | POA: Diagnosis not present

## 2016-12-05 DIAGNOSIS — E785 Hyperlipidemia, unspecified: Secondary | ICD-10-CM | POA: Diagnosis not present

## 2016-12-05 DIAGNOSIS — Z96641 Presence of right artificial hip joint: Secondary | ICD-10-CM | POA: Diagnosis not present

## 2016-12-05 DIAGNOSIS — I272 Pulmonary hypertension, unspecified: Secondary | ICD-10-CM | POA: Diagnosis not present

## 2016-12-05 DIAGNOSIS — Z794 Long term (current) use of insulin: Secondary | ICD-10-CM | POA: Diagnosis not present

## 2016-12-08 DIAGNOSIS — Z7982 Long term (current) use of aspirin: Secondary | ICD-10-CM | POA: Diagnosis not present

## 2016-12-08 DIAGNOSIS — Z471 Aftercare following joint replacement surgery: Secondary | ICD-10-CM | POA: Diagnosis not present

## 2016-12-08 DIAGNOSIS — I10 Essential (primary) hypertension: Secondary | ICD-10-CM | POA: Diagnosis not present

## 2016-12-08 DIAGNOSIS — E119 Type 2 diabetes mellitus without complications: Secondary | ICD-10-CM | POA: Diagnosis not present

## 2016-12-08 DIAGNOSIS — Z792 Long term (current) use of antibiotics: Secondary | ICD-10-CM | POA: Diagnosis not present

## 2016-12-08 DIAGNOSIS — I272 Pulmonary hypertension, unspecified: Secondary | ICD-10-CM | POA: Diagnosis not present

## 2016-12-08 DIAGNOSIS — Z7901 Long term (current) use of anticoagulants: Secondary | ICD-10-CM | POA: Diagnosis not present

## 2016-12-08 DIAGNOSIS — Z96641 Presence of right artificial hip joint: Secondary | ICD-10-CM | POA: Diagnosis not present

## 2016-12-08 DIAGNOSIS — E785 Hyperlipidemia, unspecified: Secondary | ICD-10-CM | POA: Diagnosis not present

## 2016-12-08 DIAGNOSIS — Z940 Kidney transplant status: Secondary | ICD-10-CM | POA: Diagnosis not present

## 2016-12-08 DIAGNOSIS — Z7952 Long term (current) use of systemic steroids: Secondary | ICD-10-CM | POA: Diagnosis not present

## 2016-12-08 DIAGNOSIS — Z794 Long term (current) use of insulin: Secondary | ICD-10-CM | POA: Diagnosis not present

## 2016-12-10 DIAGNOSIS — Z96641 Presence of right artificial hip joint: Secondary | ICD-10-CM | POA: Diagnosis not present

## 2016-12-10 DIAGNOSIS — Z7952 Long term (current) use of systemic steroids: Secondary | ICD-10-CM | POA: Diagnosis not present

## 2016-12-10 DIAGNOSIS — Z471 Aftercare following joint replacement surgery: Secondary | ICD-10-CM | POA: Diagnosis not present

## 2016-12-10 DIAGNOSIS — E119 Type 2 diabetes mellitus without complications: Secondary | ICD-10-CM | POA: Diagnosis not present

## 2016-12-10 DIAGNOSIS — Z7901 Long term (current) use of anticoagulants: Secondary | ICD-10-CM | POA: Diagnosis not present

## 2016-12-10 DIAGNOSIS — Z792 Long term (current) use of antibiotics: Secondary | ICD-10-CM | POA: Diagnosis not present

## 2016-12-10 DIAGNOSIS — D631 Anemia in chronic kidney disease: Secondary | ICD-10-CM | POA: Diagnosis not present

## 2016-12-10 DIAGNOSIS — E559 Vitamin D deficiency, unspecified: Secondary | ICD-10-CM | POA: Diagnosis not present

## 2016-12-10 DIAGNOSIS — E785 Hyperlipidemia, unspecified: Secondary | ICD-10-CM | POA: Diagnosis not present

## 2016-12-10 DIAGNOSIS — Z7982 Long term (current) use of aspirin: Secondary | ICD-10-CM | POA: Diagnosis not present

## 2016-12-10 DIAGNOSIS — I10 Essential (primary) hypertension: Secondary | ICD-10-CM | POA: Diagnosis not present

## 2016-12-10 DIAGNOSIS — N2581 Secondary hyperparathyroidism of renal origin: Secondary | ICD-10-CM | POA: Diagnosis not present

## 2016-12-10 DIAGNOSIS — Z794 Long term (current) use of insulin: Secondary | ICD-10-CM | POA: Diagnosis not present

## 2016-12-10 DIAGNOSIS — I272 Pulmonary hypertension, unspecified: Secondary | ICD-10-CM | POA: Diagnosis not present

## 2016-12-10 DIAGNOSIS — Z940 Kidney transplant status: Secondary | ICD-10-CM | POA: Diagnosis not present

## 2016-12-10 DIAGNOSIS — I129 Hypertensive chronic kidney disease with stage 1 through stage 4 chronic kidney disease, or unspecified chronic kidney disease: Secondary | ICD-10-CM | POA: Diagnosis not present

## 2016-12-12 ENCOUNTER — Other Ambulatory Visit: Payer: Self-pay | Admitting: Endocrinology

## 2016-12-16 ENCOUNTER — Telehealth: Payer: Self-pay | Admitting: Endocrinology

## 2016-12-16 DIAGNOSIS — Z96641 Presence of right artificial hip joint: Secondary | ICD-10-CM | POA: Diagnosis not present

## 2016-12-16 DIAGNOSIS — Z940 Kidney transplant status: Secondary | ICD-10-CM | POA: Diagnosis not present

## 2016-12-16 DIAGNOSIS — Z7901 Long term (current) use of anticoagulants: Secondary | ICD-10-CM | POA: Diagnosis not present

## 2016-12-16 DIAGNOSIS — Z471 Aftercare following joint replacement surgery: Secondary | ICD-10-CM | POA: Diagnosis not present

## 2016-12-16 DIAGNOSIS — Z7952 Long term (current) use of systemic steroids: Secondary | ICD-10-CM | POA: Diagnosis not present

## 2016-12-16 DIAGNOSIS — E785 Hyperlipidemia, unspecified: Secondary | ICD-10-CM | POA: Diagnosis not present

## 2016-12-16 DIAGNOSIS — Z794 Long term (current) use of insulin: Secondary | ICD-10-CM | POA: Diagnosis not present

## 2016-12-16 DIAGNOSIS — Z7982 Long term (current) use of aspirin: Secondary | ICD-10-CM | POA: Diagnosis not present

## 2016-12-16 DIAGNOSIS — I10 Essential (primary) hypertension: Secondary | ICD-10-CM | POA: Diagnosis not present

## 2016-12-16 DIAGNOSIS — Z792 Long term (current) use of antibiotics: Secondary | ICD-10-CM | POA: Diagnosis not present

## 2016-12-16 DIAGNOSIS — I272 Pulmonary hypertension, unspecified: Secondary | ICD-10-CM | POA: Diagnosis not present

## 2016-12-16 DIAGNOSIS — E119 Type 2 diabetes mellitus without complications: Secondary | ICD-10-CM | POA: Diagnosis not present

## 2016-12-16 NOTE — Telephone Encounter (Signed)
Order form requested from [place].

## 2016-12-16 NOTE — Telephone Encounter (Signed)
[facility] called on the status of patient diabetic medical supplys.  [phone]

## 2016-12-17 DIAGNOSIS — Z5181 Encounter for therapeutic drug level monitoring: Secondary | ICD-10-CM | POA: Diagnosis not present

## 2016-12-17 DIAGNOSIS — N186 End stage renal disease: Secondary | ICD-10-CM | POA: Diagnosis not present

## 2016-12-17 DIAGNOSIS — T8612 Kidney transplant failure: Secondary | ICD-10-CM | POA: Diagnosis not present

## 2016-12-17 DIAGNOSIS — Z794 Long term (current) use of insulin: Secondary | ICD-10-CM | POA: Diagnosis not present

## 2016-12-17 DIAGNOSIS — Z940 Kidney transplant status: Secondary | ICD-10-CM | POA: Diagnosis not present

## 2016-12-17 DIAGNOSIS — R7989 Other specified abnormal findings of blood chemistry: Secondary | ICD-10-CM | POA: Diagnosis not present

## 2016-12-17 DIAGNOSIS — R000 Tachycardia, unspecified: Secondary | ICD-10-CM | POA: Diagnosis not present

## 2016-12-17 DIAGNOSIS — E860 Dehydration: Secondary | ICD-10-CM | POA: Diagnosis not present

## 2016-12-17 DIAGNOSIS — Z87440 Personal history of urinary (tract) infections: Secondary | ICD-10-CM | POA: Diagnosis not present

## 2016-12-17 DIAGNOSIS — T8611 Kidney transplant rejection: Secondary | ICD-10-CM | POA: Diagnosis not present

## 2016-12-17 DIAGNOSIS — I120 Hypertensive chronic kidney disease with stage 5 chronic kidney disease or end stage renal disease: Secondary | ICD-10-CM | POA: Diagnosis not present

## 2016-12-17 DIAGNOSIS — D72819 Decreased white blood cell count, unspecified: Secondary | ICD-10-CM | POA: Diagnosis not present

## 2016-12-17 DIAGNOSIS — I10 Essential (primary) hypertension: Secondary | ICD-10-CM | POA: Diagnosis not present

## 2016-12-17 DIAGNOSIS — E119 Type 2 diabetes mellitus without complications: Secondary | ICD-10-CM | POA: Diagnosis not present

## 2016-12-17 DIAGNOSIS — Z79899 Other long term (current) drug therapy: Secondary | ICD-10-CM | POA: Diagnosis not present

## 2016-12-17 DIAGNOSIS — D631 Anemia in chronic kidney disease: Secondary | ICD-10-CM | POA: Diagnosis not present

## 2016-12-17 DIAGNOSIS — N179 Acute kidney failure, unspecified: Secondary | ICD-10-CM | POA: Diagnosis not present

## 2016-12-17 DIAGNOSIS — E872 Acidosis: Secondary | ICD-10-CM | POA: Diagnosis not present

## 2016-12-18 DIAGNOSIS — Z79899 Other long term (current) drug therapy: Secondary | ICD-10-CM | POA: Diagnosis not present

## 2016-12-18 DIAGNOSIS — E119 Type 2 diabetes mellitus without complications: Secondary | ICD-10-CM | POA: Diagnosis not present

## 2016-12-18 DIAGNOSIS — Z794 Long term (current) use of insulin: Secondary | ICD-10-CM | POA: Diagnosis not present

## 2016-12-18 DIAGNOSIS — T8611 Kidney transplant rejection: Secondary | ICD-10-CM | POA: Diagnosis not present

## 2016-12-18 DIAGNOSIS — Z5181 Encounter for therapeutic drug level monitoring: Secondary | ICD-10-CM | POA: Diagnosis not present

## 2016-12-18 DIAGNOSIS — N179 Acute kidney failure, unspecified: Secondary | ICD-10-CM | POA: Diagnosis not present

## 2016-12-18 DIAGNOSIS — R000 Tachycardia, unspecified: Secondary | ICD-10-CM | POA: Diagnosis not present

## 2016-12-18 DIAGNOSIS — Z87440 Personal history of urinary (tract) infections: Secondary | ICD-10-CM | POA: Diagnosis not present

## 2016-12-18 DIAGNOSIS — I10 Essential (primary) hypertension: Secondary | ICD-10-CM | POA: Diagnosis not present

## 2016-12-18 DIAGNOSIS — E860 Dehydration: Secondary | ICD-10-CM | POA: Diagnosis not present

## 2016-12-18 DIAGNOSIS — D631 Anemia in chronic kidney disease: Secondary | ICD-10-CM | POA: Diagnosis not present

## 2016-12-18 DIAGNOSIS — D72819 Decreased white blood cell count, unspecified: Secondary | ICD-10-CM | POA: Diagnosis not present

## 2016-12-18 DIAGNOSIS — I120 Hypertensive chronic kidney disease with stage 5 chronic kidney disease or end stage renal disease: Secondary | ICD-10-CM | POA: Diagnosis not present

## 2016-12-18 DIAGNOSIS — R7989 Other specified abnormal findings of blood chemistry: Secondary | ICD-10-CM | POA: Diagnosis not present

## 2016-12-18 DIAGNOSIS — E872 Acidosis: Secondary | ICD-10-CM | POA: Diagnosis not present

## 2016-12-24 DIAGNOSIS — E1122 Type 2 diabetes mellitus with diabetic chronic kidney disease: Secondary | ICD-10-CM | POA: Diagnosis not present

## 2016-12-24 DIAGNOSIS — E872 Acidosis: Secondary | ICD-10-CM | POA: Diagnosis not present

## 2016-12-24 DIAGNOSIS — D8989 Other specified disorders involving the immune mechanism, not elsewhere classified: Secondary | ICD-10-CM | POA: Diagnosis not present

## 2016-12-24 DIAGNOSIS — Z992 Dependence on renal dialysis: Secondary | ICD-10-CM | POA: Diagnosis not present

## 2016-12-24 DIAGNOSIS — D72819 Decreased white blood cell count, unspecified: Secondary | ICD-10-CM | POA: Diagnosis not present

## 2016-12-24 DIAGNOSIS — I120 Hypertensive chronic kidney disease with stage 5 chronic kidney disease or end stage renal disease: Secondary | ICD-10-CM | POA: Diagnosis not present

## 2016-12-24 DIAGNOSIS — D631 Anemia in chronic kidney disease: Secondary | ICD-10-CM | POA: Diagnosis not present

## 2016-12-24 DIAGNOSIS — I10 Essential (primary) hypertension: Secondary | ICD-10-CM | POA: Diagnosis not present

## 2016-12-24 DIAGNOSIS — N186 End stage renal disease: Secondary | ICD-10-CM | POA: Diagnosis not present

## 2016-12-24 DIAGNOSIS — Z794 Long term (current) use of insulin: Secondary | ICD-10-CM | POA: Diagnosis not present

## 2016-12-24 DIAGNOSIS — I272 Pulmonary hypertension, unspecified: Secondary | ICD-10-CM | POA: Diagnosis not present

## 2016-12-24 DIAGNOSIS — Z9889 Other specified postprocedural states: Secondary | ICD-10-CM | POA: Diagnosis not present

## 2016-12-24 DIAGNOSIS — Z79899 Other long term (current) drug therapy: Secondary | ICD-10-CM | POA: Diagnosis not present

## 2016-12-24 DIAGNOSIS — Z940 Kidney transplant status: Secondary | ICD-10-CM | POA: Diagnosis not present

## 2016-12-24 DIAGNOSIS — Z7982 Long term (current) use of aspirin: Secondary | ICD-10-CM | POA: Diagnosis not present

## 2016-12-24 DIAGNOSIS — Z96641 Presence of right artificial hip joint: Secondary | ICD-10-CM | POA: Diagnosis not present

## 2016-12-24 DIAGNOSIS — E785 Hyperlipidemia, unspecified: Secondary | ICD-10-CM | POA: Diagnosis not present

## 2016-12-24 DIAGNOSIS — Z881 Allergy status to other antibiotic agents status: Secondary | ICD-10-CM | POA: Diagnosis not present

## 2016-12-31 DIAGNOSIS — Z940 Kidney transplant status: Secondary | ICD-10-CM | POA: Diagnosis not present

## 2017-01-01 DIAGNOSIS — E1122 Type 2 diabetes mellitus with diabetic chronic kidney disease: Secondary | ICD-10-CM | POA: Diagnosis not present

## 2017-01-01 DIAGNOSIS — D631 Anemia in chronic kidney disease: Secondary | ICD-10-CM | POA: Diagnosis not present

## 2017-01-01 DIAGNOSIS — Z885 Allergy status to narcotic agent status: Secondary | ICD-10-CM | POA: Diagnosis not present

## 2017-01-01 DIAGNOSIS — E785 Hyperlipidemia, unspecified: Secondary | ICD-10-CM | POA: Diagnosis not present

## 2017-01-01 DIAGNOSIS — I120 Hypertensive chronic kidney disease with stage 5 chronic kidney disease or end stage renal disease: Secondary | ICD-10-CM | POA: Diagnosis not present

## 2017-01-01 DIAGNOSIS — N186 End stage renal disease: Secondary | ICD-10-CM | POA: Diagnosis not present

## 2017-01-01 DIAGNOSIS — Z886 Allergy status to analgesic agent status: Secondary | ICD-10-CM | POA: Diagnosis not present

## 2017-01-01 DIAGNOSIS — Z96641 Presence of right artificial hip joint: Secondary | ICD-10-CM | POA: Diagnosis not present

## 2017-01-01 DIAGNOSIS — Z992 Dependence on renal dialysis: Secondary | ICD-10-CM | POA: Diagnosis not present

## 2017-01-01 DIAGNOSIS — Z881 Allergy status to other antibiotic agents status: Secondary | ICD-10-CM | POA: Diagnosis not present

## 2017-01-01 DIAGNOSIS — Z471 Aftercare following joint replacement surgery: Secondary | ICD-10-CM | POA: Diagnosis not present

## 2017-01-07 DIAGNOSIS — Z79899 Other long term (current) drug therapy: Secondary | ICD-10-CM | POA: Diagnosis not present

## 2017-01-07 DIAGNOSIS — Z4822 Encounter for aftercare following kidney transplant: Secondary | ICD-10-CM | POA: Diagnosis not present

## 2017-01-07 DIAGNOSIS — I10 Essential (primary) hypertension: Secondary | ICD-10-CM | POA: Diagnosis not present

## 2017-01-07 DIAGNOSIS — I120 Hypertensive chronic kidney disease with stage 5 chronic kidney disease or end stage renal disease: Secondary | ICD-10-CM | POA: Diagnosis not present

## 2017-01-07 DIAGNOSIS — E785 Hyperlipidemia, unspecified: Secondary | ICD-10-CM | POA: Diagnosis not present

## 2017-01-07 DIAGNOSIS — E876 Hypokalemia: Secondary | ICD-10-CM | POA: Diagnosis not present

## 2017-01-07 DIAGNOSIS — Z7982 Long term (current) use of aspirin: Secondary | ICD-10-CM | POA: Diagnosis not present

## 2017-01-07 DIAGNOSIS — E1122 Type 2 diabetes mellitus with diabetic chronic kidney disease: Secondary | ICD-10-CM | POA: Diagnosis not present

## 2017-01-07 DIAGNOSIS — N186 End stage renal disease: Secondary | ICD-10-CM | POA: Diagnosis not present

## 2017-01-07 DIAGNOSIS — Z9889 Other specified postprocedural states: Secondary | ICD-10-CM | POA: Diagnosis not present

## 2017-01-07 DIAGNOSIS — Z794 Long term (current) use of insulin: Secondary | ICD-10-CM | POA: Diagnosis not present

## 2017-01-07 DIAGNOSIS — D8989 Other specified disorders involving the immune mechanism, not elsewhere classified: Secondary | ICD-10-CM | POA: Diagnosis not present

## 2017-01-07 DIAGNOSIS — D631 Anemia in chronic kidney disease: Secondary | ICD-10-CM | POA: Diagnosis not present

## 2017-01-07 DIAGNOSIS — Z940 Kidney transplant status: Secondary | ICD-10-CM | POA: Diagnosis not present

## 2017-01-15 DIAGNOSIS — I129 Hypertensive chronic kidney disease with stage 1 through stage 4 chronic kidney disease, or unspecified chronic kidney disease: Secondary | ICD-10-CM | POA: Diagnosis not present

## 2017-01-15 DIAGNOSIS — N2581 Secondary hyperparathyroidism of renal origin: Secondary | ICD-10-CM | POA: Diagnosis not present

## 2017-01-15 DIAGNOSIS — E119 Type 2 diabetes mellitus without complications: Secondary | ICD-10-CM | POA: Diagnosis not present

## 2017-01-15 DIAGNOSIS — D631 Anemia in chronic kidney disease: Secondary | ICD-10-CM | POA: Diagnosis not present

## 2017-01-15 DIAGNOSIS — Z940 Kidney transplant status: Secondary | ICD-10-CM | POA: Diagnosis not present

## 2017-01-16 DIAGNOSIS — R739 Hyperglycemia, unspecified: Secondary | ICD-10-CM | POA: Diagnosis not present

## 2017-01-16 DIAGNOSIS — Z940 Kidney transplant status: Secondary | ICD-10-CM | POA: Diagnosis not present

## 2017-01-16 DIAGNOSIS — D631 Anemia in chronic kidney disease: Secondary | ICD-10-CM | POA: Diagnosis not present

## 2017-01-16 DIAGNOSIS — N390 Urinary tract infection, site not specified: Secondary | ICD-10-CM | POA: Diagnosis not present

## 2017-01-27 ENCOUNTER — Encounter (HOSPITAL_COMMUNITY): Payer: Medicare Other

## 2017-01-27 ENCOUNTER — Other Ambulatory Visit (HOSPITAL_COMMUNITY): Payer: Self-pay | Admitting: *Deleted

## 2017-01-28 ENCOUNTER — Encounter (HOSPITAL_COMMUNITY)
Admission: RE | Admit: 2017-01-28 | Discharge: 2017-01-28 | Disposition: A | Discharge status: Home / Self Care | Payer: Medicare Other | Source: Ambulatory Visit | Attending: Nephrology | Admitting: Nephrology

## 2017-01-28 DIAGNOSIS — N189 Chronic kidney disease, unspecified: Secondary | ICD-10-CM | POA: Diagnosis not present

## 2017-01-28 DIAGNOSIS — D631 Anemia in chronic kidney disease: Secondary | ICD-10-CM | POA: Diagnosis not present

## 2017-01-28 DIAGNOSIS — N186 End stage renal disease: Secondary | ICD-10-CM

## 2017-01-28 LAB — POCT HEMOGLOBIN-HEMACUE: HEMOGLOBIN: 10.5 g/dL — AB (ref 12.0–15.0)

## 2017-01-28 MED ORDER — DARBEPOETIN ALFA 100 MCG/0.5ML IJ SOSY: 100.0000 ug | PREFILLED_SYRINGE | INTRAMUSCULAR | Status: DC

## 2017-01-28 MED ORDER — DARBEPOETIN ALFA 100 MCG/0.5ML IJ SOSY
PREFILLED_SYRINGE | INTRAMUSCULAR | Status: AC
  Administered 2017-01-28: 100 ug via SUBCUTANEOUS
  Filled 2017-01-28: qty 0.5

## 2017-01-28 MED ORDER — SODIUM CHLORIDE 0.9 % IV SOLN
510.0000 mg | Freq: Once | INTRAVENOUS | Status: AC
  Administered 2017-01-28: 510 mg via INTRAVENOUS
  Filled 2017-01-28: qty 17

## 2017-01-28 NOTE — Discharge Instructions (Signed)
Darbepoetin Alfa injection °What is this medicine? °DARBEPOETIN ALFA (dar be POE e tin AL fa) helps your body make more red blood cells. It is used to treat anemia caused by chronic kidney failure and chemotherapy. °This medicine may be used for other purposes; ask your health care provider or pharmacist if you have questions. °COMMON BRAND NAME(S): Aranesp °What should I tell my health care provider before I take this medicine? °They need to know if you have any of these conditions: °-blood clotting disorders or history of blood clots °-cancer patient not on chemotherapy °-cystic fibrosis °-heart disease, such as angina, heart failure, or a history of a heart attack °-hemoglobin level of 12 g/dL or greater °-high blood pressure °-low levels of folate, iron, or vitamin B12 °-seizures °-an unusual or allergic reaction to darbepoetin, erythropoietin, albumin, hamster proteins, latex, other medicines, foods, dyes, or preservatives °-pregnant or trying to get pregnant °-breast-feeding °How should I use this medicine? °This medicine is for injection into a vein or under the skin. It is usually given by a health care professional in a hospital or clinic setting. °If you get this medicine at home, you will be taught how to prepare and give this medicine. Use exactly as directed. Take your medicine at regular intervals. Do not take your medicine more often than directed. °It is important that you put your used needles and syringes in a special sharps container. Do not put them in a trash can. If you do not have a sharps container, call your pharmacist or healthcare provider to get one. °A special MedGuide will be given to you by the pharmacist with each prescription and refill. Be sure to read this information carefully each time. °Talk to your pediatrician regarding the use of this medicine in children. While this medicine may be used in children as young as 1 year for selected conditions, precautions do  apply. °Overdosage: If you think you have taken too much of this medicine contact a poison control center or emergency room at once. °NOTE: This medicine is only for you. Do not share this medicine with others. °What if I miss a dose? °If you miss a dose, take it as soon as you can. If it is almost time for your next dose, take only that dose. Do not take double or extra doses. °What may interact with this medicine? °Do not take this medicine with any of the following medications: °-epoetin alfa °This list may not describe all possible interactions. Give your health care provider a list of all the medicines, herbs, non-prescription drugs, or dietary supplements you use. Also tell them if you smoke, drink alcohol, or use illegal drugs. Some items may interact with your medicine. °What should I watch for while using this medicine? °Your condition will be monitored carefully while you are receiving this medicine. °You may need blood work done while you are taking this medicine. °What side effects may I notice from receiving this medicine? °Side effects that you should report to your doctor or health care professional as soon as possible: °-allergic reactions like skin rash, itching or hives, swelling of the face, lips, or tongue °-breathing problems °-changes in vision °-chest pain °-confusion, trouble speaking or understanding °-feeling faint or lightheaded, falls °-high blood pressure °-muscle aches or pains °-pain, swelling, warmth in the leg °-rapid weight gain °-severe headaches °-sudden numbness or weakness of the face, arm or leg °-trouble walking, dizziness, loss of balance or coordination °-seizures (convulsions) °-swelling of the ankles, feet, hands °-  unusually weak or tired °Side effects that usually do not require medical attention (report to your doctor or health care professional if they continue or are bothersome): °-diarrhea °-fever, chills (flu-like symptoms) °-headaches °-nausea, vomiting °-redness,  stinging, or swelling at site where injected °This list may not describe all possible side effects. Call your doctor for medical advice about side effects. You may report side effects to FDA at 1-800-FDA-1088. °Where should I keep my medicine? °Keep out of the reach of children. °Store in a refrigerator between 2 and 8 degrees C (36 and 46 degrees F). Do not freeze. Do not shake. Throw away any unused portion if using a single-dose vial. Throw away any unused medicine after the expiration date. °NOTE: This sheet is a summary. It may not cover all possible information. If you have questions about this medicine, talk to your doctor, pharmacist, or health care provider. °© 2018 [employer] Standard (2016-05-19 19:52:26) °Ferumoxytol injection °What is this medicine? °FERUMOXYTOL is an iron complex. Iron is used to make healthy red blood cells, which carry oxygen and nutrients throughout the body. This medicine is used to treat iron deficiency anemia in people with chronic kidney disease. °This medicine may be used for other purposes; ask your health care provider or pharmacist if you have questions. °COMMON BRAND NAME(S): Feraheme °What should I tell my health care provider before I take this medicine? °They need to know if you have any of these conditions: °-anemia not caused by low iron levels °-high levels of iron in the blood °-magnetic resonance imaging (MRI) test scheduled °-an unusual or allergic reaction to iron, other medicines, foods, dyes, or preservatives °-pregnant or trying to get pregnant °-breast-feeding °How should I use this medicine? °This medicine is for injection into a vein. It is given by a health care professional in a hospital or clinic setting. °Talk to your pediatrician regarding the use of this medicine in children. Special care may be needed. °Overdosage: If you think you have taken too much of this medicine contact a poison control center or emergency room at once. °NOTE: This medicine  is only for you. Do not share this medicine with others. °What if I miss a dose? °It is important not to miss your dose. Call your doctor or health care professional if you are unable to keep an appointment. °What may interact with this medicine? °This medicine may interact with the following medications: °-other iron products °This list may not describe all possible interactions. Give your health care provider a list of all the medicines, herbs, non-prescription drugs, or dietary supplements you use. Also tell them if you smoke, drink alcohol, or use illegal drugs. Some items may interact with your medicine. °What should I watch for while using this medicine? °Visit your doctor or healthcare professional regularly. Tell your doctor or healthcare professional if your symptoms do not start to get better or if they get worse. You may need blood work done while you are taking this medicine. °You may need to follow a special diet. Talk to your doctor. Foods that contain iron include: whole grains/cereals, dried fruits, beans, or peas, leafy green vegetables, and organ meats (liver, kidney). °What side effects may I notice from receiving this medicine? °Side effects that you should report to your doctor or health care professional as soon as possible: °-allergic reactions like skin rash, itching or hives, swelling of the face, lips, or tongue °-breathing problems °-changes in blood pressure °-feeling faint or lightheaded, falls °-fever or chills °-flushing,   sweating, or hot feelings °-swelling of the ankles or feet °Side effects that usually do not require medical attention (report to your doctor or health care professional if they continue or are bothersome): °-diarrhea °-headache °-nausea, vomiting °-stomach pain °This list may not describe all possible side effects. Call your doctor for medical advice about side effects. You may report side effects to FDA at 1-800-FDA-1088. °Where should I keep my medicine? °This drug  is given in a hospital or clinic and will not be stored at home. °NOTE: This sheet is a summary. It may not cover all possible information. If you have questions about this medicine, talk to your doctor, pharmacist, or health care provider. °© 2018 [employer]vier/Gold Standard (2015-11-01 12:41:49) ° °

## 2017-02-03 ENCOUNTER — Encounter: Payer: Self-pay | Admitting: Endocrinology

## 2017-02-03 ENCOUNTER — Ambulatory Visit (INDEPENDENT_AMBULATORY_CARE_PROVIDER_SITE_OTHER): Payer: Medicare Other | Admitting: Endocrinology

## 2017-02-03 VITALS — BP 136/84 | HR 79 | Ht 66.7 in | Wt 172.0 lb

## 2017-02-03 DIAGNOSIS — E1100 Type 2 diabetes mellitus with hyperosmolarity without nonketotic hyperglycemic-hyperosmolar coma (NKHHC): Secondary | ICD-10-CM

## 2017-02-03 LAB — POCT GLYCOSYLATED HEMOGLOBIN (HGB A1C): Hemoglobin A1C: 5.7

## 2017-02-03 NOTE — Patient Instructions (Addendum)
Although your blood sugar is better with reduced prednisone, it could increase again, because just the passage of time also increases it.   Please stop taking the insulin.  check your blood sugar twice a day.  vary the time of day when you check, between before the 3 meals, and at bedtime.  also check if you have symptoms of your blood sugar being too high or too low.  please keep a record of the readings and bring it to your next appointment here (or you can bring the meter itself).  You can write it on any piece of paper.  please call us sooner if your blood sugar goes below 70, or if you have a lot of readings over 200. Please come back for a follow-up appointment in 3-6 months.   Please call sooner if the steroids are increased again.

## 2017-02-03 NOTE — Progress Notes (Signed)
Subjective:    Patient ID: Krista Bean, female    DOB: [birth year], 51 y.o.   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  pt states she feels well in general.  She takes 2 units 3 times a day (just before each meal).  no cbg record, but states cbg's vary from 100-240.   Past Medical History:  Diagnosis Date  . Allergy    seasonal  . Blood transfusion without reported diagnosis   . ESRD (end stage renal disease) ([place])   . Heart murmur   . HIV infection ([place])   . Hyperglycemia   . Peripheral neuropathy    Feet  . Renal failure    Hx. of Kidney transplantation    Past Surgical History:  Procedure Laterality Date  . BREAST REDUCTION SURGERY    . HEMORROIDECTOMY     unsure of year but thinks 59  . KIDNEY TRANSPLANT Left 05-2014  . KIDNEY TRANSPLANT Right 2010  . SHUNTOGRAM N/A 07/01/2012   Procedure: Earney Mallet;  Surgeon: Conrad Kite, MD;  Location: [facility] CATH LAB;  Service: Cardiovascular;  Laterality: N/A;  . Transposition AVF  Feb. 22, 2011   Right  by Dr. Rosalia Hammers  . TUBAL LIGATION  1993    Social History   Social History  . Marital status: Divorced    Spouse name: N/A  . Number of children: N/A  . Years of education: N/A   Occupational History  . Not on file.   Social History Main Topics  . Smoking status: Never Smoker  . Smokeless tobacco: Never Used  . Alcohol use No  . Drug use: No  . Sexual activity: Not on file   Other Topics Concern  . Not on file   Social History Narrative  . No narrative on file    Current Outpatient Prescriptions on File Prior to Visit  Medication Sig Dispense Refill  . abacavir  (ZIAGEN) 300 MG tablet Take 300 mg by mouth 2 (two) times daily. Reported on 03/05/2016    . ALPRAZolam (XANAX) 0.25 MG tablet Take 1 tablet (0.25 mg total) by mouth at bedtime as needed. Reported on 10/30/2015 30 tablet 2  . aspirin EC 81 MG tablet Take 81 mg by mouth.    . calcium acetate (PHOSLO) 667 MG capsule Take 2 capsules by mouth Three times a day after meals.    . furosemide (LASIX) 20 MG tablet Reported on 10/30/2015    . glucose blood (ONE TOUCH ULTRA TEST) test strip 1 each by Other route 2 (two) times daily. And lancets 2/day 100 each 12  . hydrALAZINE (APRESOLINE) 50 MG tablet Take 50 mg by mouth daily. Reported on 03/05/2016    . labetalol (NORMODYNE) 200 MG tablet TAKE 1 TABLET (200 MG TOTAL) BY MOUTH 2 TIMES DAILY.    Marland Kitchen lamiVUDine (EPIVIR) 150 MG tablet Take 150 mg by mouth daily.     . metoprolol (LOPRESSOR) 100 MG tablet Take 50 mg by mouth 2 (two) times daily. Reported on 03/05/2016    . mirabegron ER (MYRBETRIQ) 50 MG TB24 tablet Take 50 mg by mouth daily.    . mycophenolate (MYFORTIC) 360 MG TBEC EC tablet Take 360  mg by mouth 2 (two) times daily.    . phosphorus (K-PHOS-NEUTRAL) 155-852-130 MG tablet Take 500 mg by mouth 2 (two) times daily.     . predniSONE (DELTASONE) 5 MG tablet Take 7.5 mg by mouth daily.     . raltegravir (ISENTRESS) 400 MG tablet TAKE 1 TABLET (400 MG TOTAL) BY MOUTH 2 TIMES DAILY.    Marland Kitchen sulfamethoxazole-trimethoprim (BACTRIM DS) 800-160 MG per tablet Take 1 tablet by mouth daily.    . tacrolimus (PROGRAF) 1 MG capsule Take 5 mg by mouth 2 (two) times daily.      No current facility-administered medications on file prior to visit.     Allergies  Allergen Reactions  . Ciprofloxacin Hives  . Vicodin [Hydrocodone-Acetaminophen] Other (See Comments)    Delusions     Family History  Problem Relation Age of Onset  . Diabetes Father   . Colon cancer Neg Hx   . Colon polyps Neg Hx   . Esophageal cancer Neg Hx   . Rectal cancer Neg Hx   . Stomach  cancer Neg Hx     BP 136/84   Pulse 79   Ht 5' 6.7" (1.694 m)   Wt 172 lb (78 kg)   SpO2 98%   BMI 27.18 kg/m   Review of Systems She denies hypoglycemia    Objective:   Physical Exam VITAL SIGNS:  See vs page GENERAL: no distress Pulses: dorsalis pedis intact bilat.   MSK: no deformity of the feet CV: no leg edema Skin:  no ulcer on the feet.  normal color and temp on the feet. Neuro: sensation is intact to touch on the feet.   a1c=5.7%    Assessment & Plan:  DM: much better.   Renal transplantation: she is off steroids now.    Patient Instructions  Although your blood sugar is better with reduced prednisone, it could increase again, because just the passage of time also increases it.   Please stop taking the insulin.  check your blood sugar twice a day.  vary the time of day when you check, between before the 3 meals, and at bedtime.  also check if you have symptoms of your blood sugar being too high or too low.  please keep a record of the readings and bring it to your next appointment here (or you can bring the meter itself).  You can write it on any piece of paper.  please call us sooner if your blood sugar goes below 70, or if you have a lot of readings over 200. Please come back for a follow-up appointment in 3-6 months.   Please call sooner if the steroids are increased again.

## 2017-02-10 DIAGNOSIS — Z940 Kidney transplant status: Secondary | ICD-10-CM | POA: Diagnosis not present

## 2017-02-10 DIAGNOSIS — D631 Anemia in chronic kidney disease: Secondary | ICD-10-CM | POA: Diagnosis not present

## 2017-02-11 ENCOUNTER — Encounter (HOSPITAL_COMMUNITY)
Admission: RE | Admit: 2017-02-11 | Discharge: 2017-02-11 | Disposition: A | Discharge status: Home / Self Care | Payer: Medicare Other | Source: Ambulatory Visit | Attending: Nephrology | Admitting: Nephrology

## 2017-02-11 DIAGNOSIS — D631 Anemia in chronic kidney disease: Secondary | ICD-10-CM | POA: Insufficient documentation

## 2017-02-11 DIAGNOSIS — N189 Chronic kidney disease, unspecified: Secondary | ICD-10-CM | POA: Insufficient documentation

## 2017-02-11 DIAGNOSIS — N186 End stage renal disease: Secondary | ICD-10-CM

## 2017-02-11 LAB — POCT HEMOGLOBIN-HEMACUE: Hemoglobin: 12.1 g/dL (ref 12.0–15.0)

## 2017-02-11 MED ORDER — DARBEPOETIN ALFA 100 MCG/0.5ML IJ SOSY: 100.0000 ug | PREFILLED_SYRINGE | INTRAMUSCULAR | Status: DC

## 2017-02-12 DIAGNOSIS — Z940 Kidney transplant status: Secondary | ICD-10-CM | POA: Diagnosis not present

## 2017-02-12 DIAGNOSIS — I129 Hypertensive chronic kidney disease with stage 1 through stage 4 chronic kidney disease, or unspecified chronic kidney disease: Secondary | ICD-10-CM | POA: Diagnosis not present

## 2017-02-12 DIAGNOSIS — N2581 Secondary hyperparathyroidism of renal origin: Secondary | ICD-10-CM | POA: Diagnosis not present

## 2017-02-12 DIAGNOSIS — D631 Anemia in chronic kidney disease: Secondary | ICD-10-CM | POA: Diagnosis not present

## 2017-02-12 DIAGNOSIS — E119 Type 2 diabetes mellitus without complications: Secondary | ICD-10-CM | POA: Diagnosis not present

## 2017-02-17 ENCOUNTER — Other Ambulatory Visit (HOSPITAL_COMMUNITY): Payer: Self-pay | Admitting: *Deleted

## 2017-02-18 ENCOUNTER — Encounter (HOSPITAL_COMMUNITY)
Admission: RE | Admit: 2017-02-18 | Discharge: 2017-02-18 | Disposition: A | Discharge status: Home / Self Care | Payer: Medicare Other | Source: Ambulatory Visit | Attending: Nephrology | Admitting: Nephrology

## 2017-02-18 DIAGNOSIS — Z96641 Presence of right artificial hip joint: Secondary | ICD-10-CM | POA: Diagnosis not present

## 2017-02-18 DIAGNOSIS — Z471 Aftercare following joint replacement surgery: Secondary | ICD-10-CM | POA: Diagnosis not present

## 2017-02-18 DIAGNOSIS — D631 Anemia in chronic kidney disease: Secondary | ICD-10-CM | POA: Diagnosis not present

## 2017-02-18 DIAGNOSIS — N189 Chronic kidney disease, unspecified: Secondary | ICD-10-CM | POA: Diagnosis not present

## 2017-02-18 MED ORDER — FILGRASTIM 480 MCG/1.6ML IJ SOLN
480.0000 ug | Freq: Once | INTRAMUSCULAR | Status: AC
  Administered 2017-02-18: 480 ug via SUBCUTANEOUS
  Filled 2017-02-18: qty 1.6

## 2017-02-25 ENCOUNTER — Encounter (HOSPITAL_COMMUNITY): Payer: Medicare Other

## 2017-03-10 DIAGNOSIS — N390 Urinary tract infection, site not specified: Secondary | ICD-10-CM | POA: Diagnosis not present

## 2017-03-10 DIAGNOSIS — Z940 Kidney transplant status: Secondary | ICD-10-CM | POA: Diagnosis not present

## 2017-03-10 DIAGNOSIS — E119 Type 2 diabetes mellitus without complications: Secondary | ICD-10-CM | POA: Diagnosis not present

## 2017-03-13 DIAGNOSIS — D702 Other drug-induced agranulocytosis: Secondary | ICD-10-CM | POA: Diagnosis not present

## 2017-03-13 DIAGNOSIS — Z940 Kidney transplant status: Secondary | ICD-10-CM | POA: Diagnosis not present

## 2017-03-13 DIAGNOSIS — D899 Disorder involving the immune mechanism, unspecified: Secondary | ICD-10-CM | POA: Diagnosis not present

## 2017-03-13 DIAGNOSIS — Z79899 Other long term (current) drug therapy: Secondary | ICD-10-CM | POA: Diagnosis not present

## 2017-03-17 ENCOUNTER — Other Ambulatory Visit (HOSPITAL_COMMUNITY): Payer: Self-pay | Admitting: *Deleted

## 2017-03-18 ENCOUNTER — Encounter (HOSPITAL_COMMUNITY)
Admission: RE | Admit: 2017-03-18 | Discharge: 2017-03-18 | Disposition: A | Discharge status: Home / Self Care | Payer: Medicare Other | Source: Ambulatory Visit | Attending: Nephrology | Admitting: Nephrology

## 2017-03-18 DIAGNOSIS — N189 Chronic kidney disease, unspecified: Secondary | ICD-10-CM | POA: Insufficient documentation

## 2017-03-18 DIAGNOSIS — D631 Anemia in chronic kidney disease: Secondary | ICD-10-CM | POA: Diagnosis not present

## 2017-03-18 MED ORDER — TBO-FILGRASTIM 480 MCG/0.8ML ~~LOC~~ SOSY
480.0000 ug | PREFILLED_SYRINGE | Freq: Once | SUBCUTANEOUS | Status: DC
  Filled 2017-03-18: qty 0.8

## 2017-03-18 MED ORDER — FILGRASTIM 480 MCG/1.6ML IJ SOLN
480.0000 ug | INTRAMUSCULAR | Status: AC
  Administered 2017-03-18: 480 ug via SUBCUTANEOUS

## 2017-04-01 DIAGNOSIS — N390 Urinary tract infection, site not specified: Secondary | ICD-10-CM | POA: Diagnosis not present

## 2017-04-01 DIAGNOSIS — D631 Anemia in chronic kidney disease: Secondary | ICD-10-CM | POA: Diagnosis not present

## 2017-04-01 DIAGNOSIS — Z940 Kidney transplant status: Secondary | ICD-10-CM | POA: Diagnosis not present

## 2017-04-02 ENCOUNTER — Other Ambulatory Visit: Payer: Self-pay | Admitting: Nephrology

## 2017-04-02 DIAGNOSIS — R945 Abnormal results of liver function studies: Principal | ICD-10-CM

## 2017-04-02 DIAGNOSIS — R7989 Other specified abnormal findings of blood chemistry: Secondary | ICD-10-CM

## 2017-04-06 DIAGNOSIS — E119 Type 2 diabetes mellitus without complications: Secondary | ICD-10-CM | POA: Diagnosis not present

## 2017-04-06 DIAGNOSIS — Z940 Kidney transplant status: Secondary | ICD-10-CM | POA: Diagnosis not present

## 2017-04-06 DIAGNOSIS — I129 Hypertensive chronic kidney disease with stage 1 through stage 4 chronic kidney disease, or unspecified chronic kidney disease: Secondary | ICD-10-CM | POA: Diagnosis not present

## 2017-04-06 DIAGNOSIS — D631 Anemia in chronic kidney disease: Secondary | ICD-10-CM | POA: Diagnosis not present

## 2017-04-06 DIAGNOSIS — D709 Neutropenia, unspecified: Secondary | ICD-10-CM | POA: Diagnosis not present

## 2017-04-06 DIAGNOSIS — N2581 Secondary hyperparathyroidism of renal origin: Secondary | ICD-10-CM | POA: Diagnosis not present

## 2017-04-08 ENCOUNTER — Encounter (HOSPITAL_COMMUNITY): Payer: Medicare Other

## 2017-04-22 ENCOUNTER — Ambulatory Visit
Admission: RE | Admit: 2017-04-22 | Discharge: 2017-04-22 | Disposition: A | Discharge status: Home / Self Care | Payer: Medicare Other | Source: Ambulatory Visit | Attending: Nephrology | Admitting: Nephrology

## 2017-04-22 DIAGNOSIS — R7989 Other specified abnormal findings of blood chemistry: Secondary | ICD-10-CM

## 2017-04-22 DIAGNOSIS — R945 Abnormal results of liver function studies: Principal | ICD-10-CM

## 2017-04-23 MED FILL — Filgrastim Inj 480 MCG/1.6ML (300 MCG/ML): INTRAMUSCULAR | Qty: 1.6 | Status: AC

## 2017-04-28 DIAGNOSIS — Z940 Kidney transplant status: Secondary | ICD-10-CM | POA: Diagnosis not present

## 2017-05-13 DIAGNOSIS — I10 Essential (primary) hypertension: Secondary | ICD-10-CM | POA: Diagnosis not present

## 2017-05-13 DIAGNOSIS — N390 Urinary tract infection, site not specified: Secondary | ICD-10-CM | POA: Diagnosis not present

## 2017-05-13 DIAGNOSIS — Z792 Long term (current) use of antibiotics: Secondary | ICD-10-CM | POA: Diagnosis not present

## 2017-05-13 DIAGNOSIS — T8611 Kidney transplant rejection: Secondary | ICD-10-CM | POA: Diagnosis not present

## 2017-05-13 DIAGNOSIS — E872 Acidosis: Secondary | ICD-10-CM | POA: Diagnosis not present

## 2017-05-13 DIAGNOSIS — Z79899 Other long term (current) drug therapy: Secondary | ICD-10-CM | POA: Diagnosis not present

## 2017-05-13 DIAGNOSIS — Z96649 Presence of unspecified artificial hip joint: Secondary | ICD-10-CM | POA: Diagnosis not present

## 2017-05-13 DIAGNOSIS — D899 Disorder involving the immune mechanism, unspecified: Secondary | ICD-10-CM | POA: Diagnosis not present

## 2017-05-13 DIAGNOSIS — Z4822 Encounter for aftercare following kidney transplant: Secondary | ICD-10-CM | POA: Diagnosis not present

## 2017-05-13 DIAGNOSIS — Z7952 Long term (current) use of systemic steroids: Secondary | ICD-10-CM | POA: Diagnosis not present

## 2017-05-13 DIAGNOSIS — Z940 Kidney transplant status: Secondary | ICD-10-CM | POA: Diagnosis not present

## 2017-05-13 DIAGNOSIS — R252 Cramp and spasm: Secondary | ICD-10-CM | POA: Diagnosis not present

## 2017-05-13 DIAGNOSIS — D649 Anemia, unspecified: Secondary | ICD-10-CM | POA: Diagnosis not present

## 2017-05-13 DIAGNOSIS — Z8619 Personal history of other infectious and parasitic diseases: Secondary | ICD-10-CM | POA: Diagnosis not present

## 2017-05-13 DIAGNOSIS — D72819 Decreased white blood cell count, unspecified: Secondary | ICD-10-CM | POA: Diagnosis not present

## 2017-05-13 DIAGNOSIS — Z87440 Personal history of urinary (tract) infections: Secondary | ICD-10-CM | POA: Diagnosis not present

## 2017-05-13 DIAGNOSIS — E119 Type 2 diabetes mellitus without complications: Secondary | ICD-10-CM | POA: Diagnosis not present

## 2017-05-19 DIAGNOSIS — Z940 Kidney transplant status: Secondary | ICD-10-CM | POA: Diagnosis not present

## 2017-05-19 DIAGNOSIS — D899 Disorder involving the immune mechanism, unspecified: Secondary | ICD-10-CM | POA: Diagnosis not present

## 2017-06-02 DIAGNOSIS — Z01818 Encounter for other preprocedural examination: Secondary | ICD-10-CM | POA: Diagnosis not present

## 2017-06-02 DIAGNOSIS — I10 Essential (primary) hypertension: Secondary | ICD-10-CM | POA: Diagnosis not present

## 2017-06-02 DIAGNOSIS — Z940 Kidney transplant status: Secondary | ICD-10-CM | POA: Diagnosis not present

## 2017-06-02 DIAGNOSIS — Z5181 Encounter for therapeutic drug level monitoring: Secondary | ICD-10-CM | POA: Diagnosis not present

## 2017-06-02 DIAGNOSIS — N261 Atrophy of kidney (terminal): Secondary | ICD-10-CM | POA: Diagnosis not present

## 2017-06-02 DIAGNOSIS — Z79899 Other long term (current) drug therapy: Secondary | ICD-10-CM | POA: Diagnosis not present

## 2017-06-02 DIAGNOSIS — E872 Acidosis: Secondary | ICD-10-CM | POA: Diagnosis not present

## 2017-06-02 DIAGNOSIS — E119 Type 2 diabetes mellitus without complications: Secondary | ICD-10-CM | POA: Diagnosis not present

## 2017-06-02 DIAGNOSIS — N269 Renal sclerosis, unspecified: Secondary | ICD-10-CM | POA: Diagnosis not present

## 2017-06-02 DIAGNOSIS — R7989 Other specified abnormal findings of blood chemistry: Secondary | ICD-10-CM | POA: Diagnosis not present

## 2017-06-02 DIAGNOSIS — Z794 Long term (current) use of insulin: Secondary | ICD-10-CM | POA: Diagnosis not present

## 2017-06-19 DIAGNOSIS — I120 Hypertensive chronic kidney disease with stage 5 chronic kidney disease or end stage renal disease: Secondary | ICD-10-CM | POA: Diagnosis not present

## 2017-06-19 DIAGNOSIS — D702 Other drug-induced agranulocytosis: Secondary | ICD-10-CM | POA: Diagnosis not present

## 2017-06-19 DIAGNOSIS — D631 Anemia in chronic kidney disease: Secondary | ICD-10-CM | POA: Diagnosis not present

## 2017-06-19 DIAGNOSIS — Z881 Allergy status to other antibiotic agents status: Secondary | ICD-10-CM | POA: Diagnosis not present

## 2017-06-19 DIAGNOSIS — E785 Hyperlipidemia, unspecified: Secondary | ICD-10-CM | POA: Diagnosis not present

## 2017-06-19 DIAGNOSIS — D72819 Decreased white blood cell count, unspecified: Secondary | ICD-10-CM | POA: Diagnosis not present

## 2017-06-19 DIAGNOSIS — N186 End stage renal disease: Secondary | ICD-10-CM | POA: Diagnosis not present

## 2017-06-19 DIAGNOSIS — Z7982 Long term (current) use of aspirin: Secondary | ICD-10-CM | POA: Diagnosis not present

## 2017-06-19 DIAGNOSIS — M879 Osteonecrosis, unspecified: Secondary | ICD-10-CM | POA: Diagnosis not present

## 2017-06-19 DIAGNOSIS — E1140 Type 2 diabetes mellitus with diabetic neuropathy, unspecified: Secondary | ICD-10-CM | POA: Diagnosis not present

## 2017-06-19 DIAGNOSIS — D899 Disorder involving the immune mechanism, unspecified: Secondary | ICD-10-CM | POA: Diagnosis not present

## 2017-06-19 DIAGNOSIS — E872 Acidosis: Secondary | ICD-10-CM | POA: Diagnosis not present

## 2017-06-19 DIAGNOSIS — Z79899 Other long term (current) drug therapy: Secondary | ICD-10-CM | POA: Diagnosis not present

## 2017-06-19 DIAGNOSIS — I10 Essential (primary) hypertension: Secondary | ICD-10-CM | POA: Diagnosis not present

## 2017-06-19 DIAGNOSIS — Z940 Kidney transplant status: Secondary | ICD-10-CM | POA: Diagnosis not present

## 2017-06-19 DIAGNOSIS — Z794 Long term (current) use of insulin: Secondary | ICD-10-CM | POA: Diagnosis not present

## 2017-06-19 DIAGNOSIS — Z9889 Other specified postprocedural states: Secondary | ICD-10-CM | POA: Diagnosis not present

## 2017-06-19 DIAGNOSIS — Z96649 Presence of unspecified artificial hip joint: Secondary | ICD-10-CM | POA: Diagnosis not present

## 2017-06-19 DIAGNOSIS — E1122 Type 2 diabetes mellitus with diabetic chronic kidney disease: Secondary | ICD-10-CM | POA: Diagnosis not present

## 2017-06-19 DIAGNOSIS — Z4822 Encounter for aftercare following kidney transplant: Secondary | ICD-10-CM | POA: Diagnosis not present

## 2017-06-19 DIAGNOSIS — N390 Urinary tract infection, site not specified: Secondary | ICD-10-CM | POA: Diagnosis not present

## 2017-07-27 DIAGNOSIS — Z940 Kidney transplant status: Secondary | ICD-10-CM | POA: Diagnosis not present

## 2017-07-27 DIAGNOSIS — E785 Hyperlipidemia, unspecified: Secondary | ICD-10-CM | POA: Diagnosis not present

## 2017-07-27 DIAGNOSIS — E559 Vitamin D deficiency, unspecified: Secondary | ICD-10-CM | POA: Diagnosis not present

## 2017-07-27 DIAGNOSIS — Z7689 Persons encountering health services in other specified circumstances: Secondary | ICD-10-CM | POA: Diagnosis not present

## 2017-07-27 DIAGNOSIS — I129 Hypertensive chronic kidney disease with stage 1 through stage 4 chronic kidney disease, or unspecified chronic kidney disease: Secondary | ICD-10-CM | POA: Diagnosis not present

## 2017-07-27 DIAGNOSIS — N390 Urinary tract infection, site not specified: Secondary | ICD-10-CM | POA: Diagnosis not present

## 2017-07-27 DIAGNOSIS — E119 Type 2 diabetes mellitus without complications: Secondary | ICD-10-CM | POA: Diagnosis not present

## 2017-08-03 NOTE — Progress Notes (Signed)
Subjective:    Patient ID: Krista Bean, female    DOB: Nov 08, 1965, 51 y.o.   MRN: [id number]  HPI She is here for a physical exam.   She has no concerns.   She did have a right hip replacement earlier this year.  She is doing well.  She is up to date with transplant, nephrology and ID.  Medications and allergies reviewed with patient and updated if appropriate.  Patient Active Problem List   Diagnosis Date Noted  . Sleep difficulties 03/19/2016  . Type 2 diabetes mellitus ([place]) 03/05/2016  . Anxiety 06/09/2014  . Gout 09/13/2013  . End stage renal disease ([place]) 06/29/2012  . Avascular necrosis of femoral head ([place]) 09/13/2011  . Pulmonary hypertension ([place]) 09/09/2011  . KIDNEY TRANSPLANTATION, HX OF 12/08/2007  . PERIPHERAL NEUROPATHY, FEET 12/28/2006  . HIV DISEASE 07/21/2006    Current Outpatient Prescriptions on File Prior to Visit  Medication Sig Dispense Refill  . abacavir (ZIAGEN) 300 MG tablet Take 300 mg by mouth 2 (two) times daily. Reported on 03/05/2016    . ALPRAZolam (XANAX) 0.25 MG tablet Take 1 tablet (0.25 mg total) by mouth at bedtime as needed. Reported on 10/30/2015 30 tablet 2  . aspirin EC 81 MG tablet Take 81 mg by mouth.    . calcium acetate (PHOSLO) 667 MG capsule Take 2 capsules by mouth Three times a day after meals.    . furosemide (LASIX) 20 MG tablet Reported on 10/30/2015    . glucose blood (ONE TOUCH ULTRA TEST) test strip 1 each by Other route 2 (two) times daily. And lancets 2/day 100 each 12  . hydrALAZINE (APRESOLINE) 50 MG tablet Take 50 mg by mouth daily. Reported on 03/05/2016    . labetalol (NORMODYNE) 200 MG tablet TAKE 1 TABLET (200 MG TOTAL) BY MOUTH 2 TIMES DAILY.    Marland Kitchen lamiVUDine (EPIVIR) 150 MG tablet Take 150 mg by mouth daily.     . metoprolol (LOPRESSOR) 100 MG tablet Take 50 mg by mouth 2 (two) times daily. Reported on 03/05/2016    . mirabegron ER (MYRBETRIQ) 50 MG TB24 tablet Take 50 mg by mouth daily.    . mycophenolate  (MYFORTIC) 360 MG TBEC EC tablet Take 360 mg by mouth 2 (two) times daily.    . phosphorus (K-PHOS-NEUTRAL) 155-852-130 MG tablet Take 500 mg by mouth 2 (two) times daily.     . predniSONE (DELTASONE) 5 MG tablet Take 7.5 mg by mouth daily.     . raltegravir (ISENTRESS) 400 MG tablet TAKE 1 TABLET (400 MG TOTAL) BY MOUTH 2 TIMES DAILY.    Marland Kitchen sulfamethoxazole-trimethoprim (BACTRIM DS) 800-160 MG per tablet Take 1 tablet by mouth daily.    . tacrolimus (PROGRAF) 1 MG capsule Take 5 mg by mouth 2 (two) times daily.      No current facility-administered medications on file prior to visit.     Past Medical History:  Diagnosis Date  . Allergy    seasonal  . Blood transfusion without reported diagnosis   . ESRD (end stage renal disease) ([place])   . Heart murmur   . HIV infection ([place])   . Hyperglycemia   . Peripheral neuropathy    Feet  . Renal failure    Hx. of Kidney transplantation    Past Surgical History:  Procedure Laterality Date  . BREAST REDUCTION SURGERY    . HEMORROIDECTOMY     unsure of year but thinks 52  . KIDNEY TRANSPLANT  Left 05-2014  . KIDNEY TRANSPLANT Right 2010  . SHUNTOGRAM N/A 07/01/2012   Procedure: Earney Mallet;  Surgeon: Conrad Mount Hope, MD;  Location: [facility] CATH LAB;  Service: Cardiovascular;  Laterality: N/A;  . Transposition AVF  Feb. 22, 2011   Right  by Dr. Rosalia Hammers  . TUBAL LIGATION  1993    Social History   Social History  . Marital status: Divorced    Spouse name: N/A  . Number of children: N/A  . Years of education: N/A   Social History Main Topics  . Smoking status: Never Smoker  . Smokeless tobacco: Never Used  . Alcohol use No  . Drug use: No  . Sexual activity: Not Asked   Other Topics Concern  . None   Social History Narrative  . None    Family History  Problem Relation Age of Onset  . Diabetes Father   . Colon cancer Neg Hx   . Colon polyps Neg Hx   . Esophageal cancer Neg Hx   . Rectal cancer Neg Hx   . Stomach cancer Neg  Hx     Review of Systems  Constitutional: Negative for appetite change, chills, fatigue, fever and unexpected weight change.  Eyes: Negative for visual disturbance.  Respiratory: Negative for cough, shortness of breath and wheezing.   Cardiovascular: Negative for chest pain, palpitations and leg swelling.  Gastrointestinal: Positive for constipation (controlled). Negative for abdominal pain, blood in stool, diarrhea and nausea.       No gerd  Genitourinary: Negative for dysuria and hematuria.  Musculoskeletal: Positive for arthralgias (hands) and myalgias (cramping). Negative for back pain.  Skin: Negative for color change and rash.  Neurological: Negative for dizziness, light-headedness and headaches.  Psychiatric/Behavioral: Negative for dysphoric mood and sleep disturbance. The patient is not nervous/anxious.        Objective:   Vitals:   08/04/17 0810  BP: 122/68  Pulse: 78  Resp: 16  Temp: 97.8 F (36.6 C)  SpO2: 98%   Filed Weights   08/04/17 0810  Weight: 179 lb (81.2 kg)   Body mass index is 28.29 kg/m.  Wt Readings from Last 3 Encounters:  08/04/17 179 lb (81.2 kg)  02/03/17 172 lb (78 kg)  01/28/17 173 lb (78.5 kg)     Physical Exam Constitutional: She appears well-developed and well-nourished. No distress.  HENT:  Head: Normocephalic and atraumatic.  Right Ear: External ear normal. Normal ear canal and TM Left Ear: External ear normal.  Normal ear canal and TM Mouth/Throat: Oropharynx is clear and moist.  Eyes: Conjunctivae and EOM are normal.  Neck: Neck supple. No tracheal deviation present. No thyromegaly present.  No carotid bruit  Cardiovascular: Normal rate, regular rhythm and normal heart sounds.   No murmur heard.  No edema. Pulmonary/Chest: Effort normal and breath sounds normal. No respiratory distress. She has no wheezes. She has no rales.  Breast: deferred to Gyn Abdominal: Soft. She exhibits no distension. There is no tenderness.    Lymphadenopathy: She has no cervical adenopathy.  Skin: Skin is warm and dry. She is not diaphoretic. lipoma like lesion right mid abdomen - non tender, mobile, superficial; small abrasion right ear above tragus - no evidence of infection, no swelling Psychiatric: She has a normal mood and affect. Her behavior is normal.        Assessment & Plan:   Physical exam: Screening blood work  Not needed Immunizations  Flu vaccine today,  ? Td due - she will  check with ID  Colonoscopy    Up to date  Mammogram  Up to date  Gyn      scheduled Eye exams  Up to date  EKG  Last done 2013 Exercise  Irregular now, will join a gym Weight  - will work on weight loss Skin         no concerns Substance abuse   none  See Problem List for Assessment and Plan of chronic medical problems.   FU in one year

## 2017-08-03 NOTE — Patient Instructions (Addendum)
All other Health Maintenance issues reviewed.   All recommended immunizations and age-appropriate screenings are up-to-date or discussed.  Flu immunization administered today.   Medications reviewed and updated.  No changes recommended at this time.  Your prescription(s) have been submitted to your pharmacy. Please take as directed and contact our office if you believe you are having problem(s) with the medication(s).   Please followup in one year   Health Maintenance, Female Adopting a healthy lifestyle and getting preventive care can go a long way to promote health and wellness. Talk with your health care provider about what schedule of regular examinations is right for you. This is a good chance for you to check in with your provider about disease prevention and staying healthy. In between checkups, there are plenty of things you can do on your own. Experts have done a lot of research about which lifestyle changes and preventive measures are most likely to keep you healthy. Ask your health care provider for more information. Weight and diet Eat a healthy diet  Be sure to include plenty of vegetables, fruits, low-fat dairy products, and lean protein.  Do not eat a lot of foods high in solid fats, added sugars, or salt.  Get regular exercise. This is one of the most important things you can do for your health. ? Most adults should exercise for at least 150 minutes each week. The exercise should increase your heart rate and make you sweat (moderate-intensity exercise). ? Most adults should also do strengthening exercises at least twice a week. This is in addition to the moderate-intensity exercise.  Maintain a healthy weight  Body mass index (BMI) is a measurement that can be used to identify possible weight problems. It estimates body fat based on height and weight. Your health care provider can help determine your BMI and help you achieve or maintain a healthy weight.  For females 52  years of age and older: ? A BMI below 18.5 is considered underweight. ? A BMI of 18.5 to 24.9 is normal. ? A BMI of 25 to 29.9 is considered overweight. ? A BMI of 30 and above is considered obese.  Watch levels of cholesterol and blood lipids  You should start having your blood tested for lipids and cholesterol at 52 years of age, then have this test every 5 years.  You may need to have your cholesterol levels checked more often if: ? Your lipid or cholesterol levels are high. ? You are older than 52 years of age. ? You are at high risk for heart disease.  Cancer screening Lung Cancer  Lung cancer screening is recommended for adults 4-3 years old who are at high risk for lung cancer because of a history of smoking.  A yearly low-dose CT scan of the lungs is recommended for people who: ? Currently smoke. ? Have quit within the past 15 years. ? Have at least a 30-pack-year history of smoking. A pack year is smoking an average of one pack of cigarettes a day for 1 year.  Yearly screening should continue until it has been 15 years since you quit.  Yearly screening should stop if you develop a health problem that would prevent you from having lung cancer treatment.  Breast Cancer  Practice breast self-awareness. This means understanding how your breasts normally appear and feel.  It also means doing regular breast self-exams. Let your health care provider know about any changes, no matter how small.  If you are in your 3s  or 36s, you should have a clinical breast exam (CBE) by a health care provider every 1-3 years as part of a regular health exam.  If you are 15 or older, have a CBE every year. Also consider having a breast X-ray (mammogram) every year.  If you have a family history of breast cancer, talk to your health care provider about genetic screening.  If you are at high risk for breast cancer, talk to your health care provider about having an MRI and a mammogram every  year.  Breast cancer gene (BRCA) assessment is recommended for women who have family members with BRCA-related cancers. BRCA-related cancers include: ? Breast. ? Ovarian. ? Tubal. ? Peritoneal cancers.  Results of the assessment will determine the need for genetic counseling and BRCA1 and BRCA2 testing.  Cervical Cancer Your health care provider may recommend that you be screened regularly for cancer of the pelvic organs (ovaries, uterus, and vagina). This screening involves a pelvic examination, including checking for microscopic changes to the surface of your cervix (Pap test). You may be encouraged to have this screening done every 3 years, beginning at age 23.  For women ages 52-65, health care providers may recommend pelvic exams and Pap testing every 3 years, or they may recommend the Pap and pelvic exam, combined with testing for human papilloma virus (HPV), every 5 years. Some types of HPV increase your risk of cervical cancer. Testing for HPV may also be done on women of any age with unclear Pap test results.  Other health care providers may not recommend any screening for nonpregnant women who are considered low risk for pelvic cancer and who do not have symptoms. Ask your health care provider if a screening pelvic exam is right for you.  If you have had past treatment for cervical cancer or a condition that could lead to cancer, you need Pap tests and screening for cancer for at least 20 years after your treatment. If Pap tests have been discontinued, your risk factors (such as having a new sexual partner) need to be reassessed to determine if screening should resume. Some women have medical problems that increase the chance of getting cervical cancer. In these cases, your health care provider may recommend more frequent screening and Pap tests.  Colorectal Cancer  This type of cancer can be detected and often prevented.  Routine colorectal cancer screening usually begins at 52  years of age and continues through 52 years of age.  Your health care provider may recommend screening at an earlier age if you have risk factors for colon cancer.  Your health care provider may also recommend using home test kits to check for hidden blood in the stool.  A small camera at the end of a tube can be used to examine your colon directly (sigmoidoscopy or colonoscopy). This is done to check for the earliest forms of colorectal cancer.  Routine screening usually begins at age 56.  Direct examination of the colon should be repeated every 5-10 years through 52 years of age. However, you may need to be screened more often if early forms of precancerous polyps or small growths are found.  Skin Cancer  Check your skin from head to toe regularly.  Tell your health care provider about any new moles or changes in moles, especially if there is a change in a mole's shape or color.  Also tell your health care provider if you have a mole that is larger than the size of a  pencil eraser.  Always use sunscreen. Apply sunscreen liberally and repeatedly throughout the day.  Protect yourself by wearing long sleeves, pants, a wide-brimmed hat, and sunglasses whenever you are outside.  Heart disease, diabetes, and high blood pressure  High blood pressure causes heart disease and increases the risk of stroke. High blood pressure is more likely to develop in: ? People who have blood pressure in the high end of the normal range (130-139/85-89 mm Hg). ? People who are overweight or obese. ? People who are African American.  If you are 97-78 years of age, have your blood pressure checked every 3-5 years. If you are 68 years of age or older, have your blood pressure checked every year. You should have your blood pressure measured twice-once when you are at a hospital or clinic, and once when you are not at a hospital or clinic. Record the average of the two measurements. To check your blood pressure  when you are not at a hospital or clinic, you can use: ? An automated blood pressure machine at a pharmacy. ? A home blood pressure monitor.  If you are between 62 years and 58 years old, ask your health care provider if you should take aspirin to prevent strokes.  Have regular diabetes screenings. This involves taking a blood sample to check your fasting blood sugar level. ? If you are at a normal weight and have a low risk for diabetes, have this test once every three years after 52 years of age. ? If you are overweight and have a high risk for diabetes, consider being tested at a younger age or more often. Preventing infection Hepatitis B  If you have a higher risk for hepatitis B, you should be screened for this virus. You are considered at high risk for hepatitis B if: ? You were born in a country where hepatitis B is common. Ask your health care provider which countries are considered high risk. ? Your parents were born in a high-risk country, and you have not been immunized against hepatitis B (hepatitis B vaccine). ? You have HIV or AIDS. ? You use needles to inject street drugs. ? You live with someone who has hepatitis B. ? You have had sex with someone who has hepatitis B. ? You get hemodialysis treatment. ? You take certain medicines for conditions, including cancer, organ transplantation, and autoimmune conditions.  Hepatitis C  Blood testing is recommended for: ? Everyone born from [birth year] through 1966. ? Anyone with known risk factors for hepatitis C.  Sexually transmitted infections (STIs)  You should be screened for sexually transmitted infections (STIs) including gonorrhea and chlamydia if: ? You are sexually active and are younger than 52 years of age. ? You are older than 52 years of age and your health care provider tells you that you are at risk for this type of infection. ? Your sexual activity has changed since you were last screened and you are at an increased  risk for chlamydia or gonorrhea. Ask your health care provider if you are at risk.  If you do not have HIV, but are at risk, it may be recommended that you take a prescription medicine daily to prevent HIV infection. This is called pre-exposure prophylaxis (PrEP). You are considered at risk if: ? You are sexually active and do not regularly use condoms or know the HIV status of your partner(s). ? You take drugs by injection. ? You are sexually active with a partner who has HIV.  Talk with your health care provider about whether you are at high risk of being infected with HIV. If you choose to begin PrEP, you should first be tested for HIV. You should then be tested every 3 months for as long as you are taking PrEP. Pregnancy  If you are premenopausal and you may become pregnant, ask your health care provider about preconception counseling.  If you may become pregnant, take 400 to 800 micrograms (mcg) of folic acid every day.  If you want to prevent pregnancy, talk to your health care provider about birth control (contraception). Osteoporosis and menopause  Osteoporosis is a disease in which the bones lose minerals and strength with aging. This can result in serious bone fractures. Your risk for osteoporosis can be identified using a bone density scan.  If you are 28 years of age or older, or if you are at risk for osteoporosis and fractures, ask your health care provider if you should be screened.  Ask your health care provider whether you should take a calcium or vitamin D supplement to lower your risk for osteoporosis.  Menopause may have certain physical symptoms and risks.  Hormone replacement therapy may reduce some of these symptoms and risks. Talk to your health care provider about whether hormone replacement therapy is right for you. Follow these instructions at home:  Schedule regular health, dental, and eye exams.  Stay current with your immunizations.  Do not use any  tobacco products including cigarettes, chewing tobacco, or electronic cigarettes.  If you are pregnant, do not drink alcohol.  If you are breastfeeding, limit how much and how often you drink alcohol.  Limit alcohol intake to no more than 1 drink per day for nonpregnant women. One drink equals 12 ounces of beer, 5 ounces of wine, or 1 ounces of hard liquor.  Do not use street drugs.  Do not share needles.  Ask your health care provider for help if you need support or information about quitting drugs.  Tell your health care provider if you often feel depressed.  Tell your health care provider if you have ever been abused or do not feel safe at home. This information is not intended to replace advice given to you by your health care provider. Make sure you discuss any questions you have with your health care provider. Document Released: 04/14/2011 Document Revised: 03/06/2016 Document Reviewed: 07/03/2015 Elsevier Interactive Patient Education  [employer].

## 2017-08-04 ENCOUNTER — Encounter: Payer: Self-pay | Admitting: Internal Medicine

## 2017-08-04 ENCOUNTER — Ambulatory Visit (INDEPENDENT_AMBULATORY_CARE_PROVIDER_SITE_OTHER): Payer: Medicare Other | Admitting: Internal Medicine

## 2017-08-04 VITALS — BP 122/68 | HR 78 | Temp 97.8°F | Resp 16 | Ht 66.7 in | Wt 179.0 lb

## 2017-08-04 DIAGNOSIS — Z23 Encounter for immunization: Secondary | ICD-10-CM

## 2017-08-04 DIAGNOSIS — F419 Anxiety disorder, unspecified: Secondary | ICD-10-CM

## 2017-08-04 DIAGNOSIS — Z0001 Encounter for general adult medical examination with abnormal findings: Secondary | ICD-10-CM | POA: Diagnosis not present

## 2017-08-04 DIAGNOSIS — E1100 Type 2 diabetes mellitus with hyperosmolarity without nonketotic hyperglycemic-hyperosmolar coma (NKHHC): Secondary | ICD-10-CM | POA: Diagnosis not present

## 2017-08-04 DIAGNOSIS — Z96641 Presence of right artificial hip joint: Secondary | ICD-10-CM | POA: Insufficient documentation

## 2017-08-04 DIAGNOSIS — N186 End stage renal disease: Secondary | ICD-10-CM

## 2017-08-04 MED ORDER — ALPRAZOLAM 0.25 MG PO TABS
0.2500 mg | ORAL_TABLET | Freq: Every evening | ORAL | 0 refills | Status: DC | PRN
Start: 2017-08-04 — End: 2018-01-15

## 2017-08-04 NOTE — Assessment & Plan Note (Signed)
Diet controlled Last a1c 5.6%

## 2017-08-04 NOTE — Assessment & Plan Note (Signed)
S/p kidney transplnat Following with nephrology and transplant

## 2017-08-04 NOTE — Assessment & Plan Note (Signed)
Uses xanax rarely Continue - refilled today

## 2017-08-05 ENCOUNTER — Ambulatory Visit (INDEPENDENT_AMBULATORY_CARE_PROVIDER_SITE_OTHER): Payer: Medicare Other | Admitting: Endocrinology

## 2017-08-05 ENCOUNTER — Encounter: Payer: Self-pay | Admitting: Endocrinology

## 2017-08-05 VITALS — BP 122/70 | HR 75 | Wt 180.2 lb

## 2017-08-05 DIAGNOSIS — E1100 Type 2 diabetes mellitus with hyperosmolarity without nonketotic hyperglycemic-hyperosmolar coma (NKHHC): Secondary | ICD-10-CM | POA: Diagnosis not present

## 2017-08-05 LAB — POCT GLYCOSYLATED HEMOGLOBIN (HGB A1C): HEMOGLOBIN A1C: 5.3

## 2017-08-05 NOTE — Progress Notes (Signed)
Subjective:    Patient ID: Krista Bean, female    DOB: 07/02/1966, 51 y.o.   MRN: [id number]  HPI Pt returns for f/u of diabetes mellitus: DM type: due to steroids Dx'ed: 5003 Complications: polyneuropathy and ESRD (transplant in 2015, but ESRD has recurred) Therapy: none now GDM: never DKA: never Severe hypoglycemia: never Pancreatitis: never Other: she took insulin from 2017-2018.  Interval history: She takes prednisone, still 7.5 mg per day.  pt states she feels well in general.  She takes no medication for DM now.  Pt states cbg's vary from 90-149   Past Medical History:  Diagnosis Date  . Allergy    seasonal  . Blood transfusion without reported diagnosis   . ESRD (end stage renal disease) ([place])   . Heart murmur   . HIV infection ([place])   . Hyperglycemia   . Peripheral neuropathy    Feet  . Renal failure    Hx. of Kidney transplantation    Past Surgical History:  Procedure Laterality Date  . BREAST REDUCTION SURGERY    . HEMORROIDECTOMY     unsure of year but thinks 83  . KIDNEY TRANSPLANT Left 05-2014  . KIDNEY TRANSPLANT Right 2010  . SHUNTOGRAM N/A 07/01/2012   Procedure: Earney Mallet;  Surgeon: Conrad Oakton, MD;  Location: [facility] CATH LAB;  Service: Cardiovascular;  Laterality: N/A;  . Transposition AVF  Feb. 22, 2011   Right  by Dr. Rosalia Hammers  . TUBAL LIGATION  1993    Social History   Social History  . Marital status: Divorced    Spouse name: N/A  . Number of children: N/A  . Years of education: N/A   Occupational History  . Not on file.   Social History Main Topics  . Smoking status: Never Smoker  . Smokeless tobacco: Never Used  . Alcohol use No  . Drug use: No  . Sexual activity: Not on file   Other Topics Concern  . Not on file   Social History Narrative  . No narrative on file    Current Outpatient Prescriptions on File Prior to Visit  Medication Sig Dispense Refill  . abacavir (ZIAGEN) 300 MG tablet Take 300 mg by mouth 2 (two)  times daily. Reported on 03/05/2016    . ALPRAZolam (XANAX) 0.25 MG tablet Take 1 tablet (0.25 mg total) by mouth at bedtime as needed. Reported on 10/30/2015 30 tablet 0  . aspirin EC 81 MG tablet Take 81 mg by mouth.    . calcium acetate (PHOSLO) 667 MG capsule Take 2 capsules by mouth Three times a day after meals.    . furosemide (LASIX) 20 MG tablet Reported on 10/30/2015    . glucose blood (ONE TOUCH ULTRA TEST) test strip 1 each by Other route 2 (two) times daily. And lancets 2/day 100 each 12  . hydrALAZINE (APRESOLINE) 50 MG tablet Take 50 mg by mouth daily. Reported on 03/05/2016    . labetalol (NORMODYNE) 200 MG tablet TAKE 1 TABLET (200 MG TOTAL) BY MOUTH 2 TIMES DAILY.    Marland Kitchen lamiVUDine (EPIVIR) 150 MG tablet Take 150 mg by mouth daily.     . metoprolol (LOPRESSOR) 100 MG tablet Take 50 mg by mouth 2 (two) times daily. Reported on 03/05/2016    . mirabegron ER (MYRBETRIQ) 50 MG TB24 tablet Take 50 mg by mouth daily.    . mycophenolate (MYFORTIC) 360 MG TBEC EC tablet Take 360 mg by mouth 2 (two) times daily.    Marland Kitchen  phosphorus (K-PHOS-NEUTRAL) 155-852-130 MG tablet Take 500 mg by mouth 2 (two) times daily.     . predniSONE (DELTASONE) 5 MG tablet Take 7.5 mg by mouth daily.     . raltegravir (ISENTRESS) 400 MG tablet TAKE 1 TABLET (400 MG TOTAL) BY MOUTH 2 TIMES DAILY.    Marland Kitchen sulfamethoxazole-trimethoprim (BACTRIM DS) 800-160 MG per tablet Take 1 tablet by mouth daily.    . tacrolimus (PROGRAF) 1 MG capsule Take 5 mg by mouth 2 (two) times daily.      No current facility-administered medications on file prior to visit.     Allergies  Allergen Reactions  . Ciprofloxacin Hives  . Vicodin [Hydrocodone-Acetaminophen] Other (See Comments)    Delusions     Family History  Problem Relation Age of Onset  . Diabetes Father   . Colon cancer Neg Hx   . Colon polyps Neg Hx   . Esophageal cancer Neg Hx   . Rectal cancer Neg Hx   . Stomach cancer Neg Hx     BP 122/70   Pulse 75   Wt 180  lb 3.2 oz (81.7 kg)   SpO2 98%   BMI 28.48 kg/m    Review of Systems No weight change.     Objective:   Physical Exam VITAL SIGNS:  See vs page GENERAL: no distress Pulses: foot pulses are intact bilaterally.   MSK: no deformity of the feet or ankles.  CV: no edema of the legs or ankles Skin:  no ulcer on the feet or ankles.  normal color and temp on the feet and ankles Neuro: sensation is intact to touch on the feet and ankles.    A1c=5.3%  Lab Results  Component Value Date   CREATININE 5.20 (H) 01/12/2016   BUN 41 (H) 01/12/2016   NA 138 01/12/2016   K 3.8 01/12/2016   CL 114 (H) 01/12/2016   CO2 12 (L) 04/13/2012      Assessment & Plan:  Type 2 DM, with polyneuropathy: no medication is needed now.   ESRD: this may be causing improvement in glycemic control.   Patient Instructions  No medication is needed for the diabetes now.   Although your blood sugar is better with reduced prednisone, it could increase again, because just the passage of time also increases it.   check your blood sugar twice a day.  vary the time of day when you check, between before the 3 meals, and at bedtime.  also check if you have symptoms of your blood sugar being too high or too low.  please keep a record of the readings and bring it to your next appointment here (or you can bring the meter itself).  You can write it on any piece of paper.  please call us sooner if your blood sugar goes below 70, or if you have a lot of readings over 200. I would be happy to see you back here as needed.

## 2017-08-05 NOTE — Patient Instructions (Addendum)
No medication is needed for the diabetes now.   Although your blood sugar is better with reduced prednisone, it could increase again, because just the passage of time also increases it.   check your blood sugar twice a day.  vary the time of day when you check, between before the 3 meals, and at bedtime.  also check if you have symptoms of your blood sugar being too high or too low.  please keep a record of the readings and bring it to your next appointment here (or you can bring the meter itself).  You can write it on any piece of paper.  please call us sooner if your blood sugar goes below 70, or if you have a lot of readings over 200. I would be happy to see you back here as needed.

## 2017-08-06 DIAGNOSIS — M25549 Pain in joints of unspecified hand: Secondary | ICD-10-CM | POA: Diagnosis not present

## 2017-08-06 DIAGNOSIS — R740 Nonspecific elevation of levels of transaminase and lactic acid dehydrogenase [LDH]: Secondary | ICD-10-CM | POA: Diagnosis not present

## 2017-08-06 DIAGNOSIS — I129 Hypertensive chronic kidney disease with stage 1 through stage 4 chronic kidney disease, or unspecified chronic kidney disease: Secondary | ICD-10-CM | POA: Diagnosis not present

## 2017-08-06 DIAGNOSIS — D709 Neutropenia, unspecified: Secondary | ICD-10-CM | POA: Diagnosis not present

## 2017-08-06 DIAGNOSIS — D631 Anemia in chronic kidney disease: Secondary | ICD-10-CM | POA: Diagnosis not present

## 2017-08-06 DIAGNOSIS — E119 Type 2 diabetes mellitus without complications: Secondary | ICD-10-CM | POA: Diagnosis not present

## 2017-08-06 DIAGNOSIS — N2581 Secondary hyperparathyroidism of renal origin: Secondary | ICD-10-CM | POA: Diagnosis not present

## 2017-08-06 DIAGNOSIS — Z940 Kidney transplant status: Secondary | ICD-10-CM | POA: Diagnosis not present

## 2017-08-27 ENCOUNTER — Ambulatory Visit: Payer: Medicare Other | Admitting: Physician Assistant

## 2017-09-09 DIAGNOSIS — Z7689 Persons encountering health services in other specified circumstances: Secondary | ICD-10-CM | POA: Diagnosis not present

## 2017-09-09 DIAGNOSIS — D631 Anemia in chronic kidney disease: Secondary | ICD-10-CM | POA: Diagnosis not present

## 2017-09-09 DIAGNOSIS — Z940 Kidney transplant status: Secondary | ICD-10-CM | POA: Diagnosis not present

## 2017-09-14 ENCOUNTER — Other Ambulatory Visit (INDEPENDENT_AMBULATORY_CARE_PROVIDER_SITE_OTHER): Payer: Medicare Other

## 2017-09-14 ENCOUNTER — Encounter: Payer: Self-pay | Admitting: Physician Assistant

## 2017-09-14 ENCOUNTER — Ambulatory Visit: Payer: Medicare Other | Admitting: Physician Assistant

## 2017-09-14 ENCOUNTER — Encounter (INDEPENDENT_AMBULATORY_CARE_PROVIDER_SITE_OTHER): Payer: Self-pay

## 2017-09-14 VITALS — BP 112/68 | HR 72 | Ht 67.0 in | Wt 175.4 lb

## 2017-09-14 DIAGNOSIS — R7989 Other specified abnormal findings of blood chemistry: Secondary | ICD-10-CM

## 2017-09-14 DIAGNOSIS — Z940 Kidney transplant status: Secondary | ICD-10-CM

## 2017-09-14 DIAGNOSIS — R945 Abnormal results of liver function studies: Secondary | ICD-10-CM

## 2017-09-14 DIAGNOSIS — B20 Human immunodeficiency virus [HIV] disease: Secondary | ICD-10-CM | POA: Diagnosis not present

## 2017-09-14 LAB — COMPREHENSIVE METABOLIC PANEL
ALBUMIN: 4.8 g/dL (ref 3.5–5.2)
ALT: 88 U/L — ABNORMAL HIGH (ref 0–35)
AST: 59 U/L — AB (ref 0–37)
Alkaline Phosphatase: 275 U/L — ABNORMAL HIGH (ref 39–117)
BUN: 43 mg/dL — AB (ref 6–23)
CALCIUM: 10.5 mg/dL (ref 8.4–10.5)
CHLORIDE: 111 meq/L (ref 96–112)
CO2: 15 meq/L — AB (ref 19–32)
Creatinine, Ser: 3.07 mg/dL — ABNORMAL HIGH (ref 0.40–1.20)
GFR: 20.46 mL/min — ABNORMAL LOW (ref >60.00)
GLUCOSE: 90 mg/dL (ref 70–99)
POTASSIUM: 4.4 meq/L (ref 3.5–5.1)
SODIUM: 139 meq/L (ref 135–145)
TOTAL PROTEIN: 8.2 g/dL (ref 6.0–8.3)
Total Bilirubin: 0.5 mg/dL (ref 0.2–1.2)

## 2017-09-14 LAB — PROTIME-INR
INR: 1 ratio (ref 0.8–1.0)
Prothrombin Time: 10.5 s (ref 9.6–13.1)

## 2017-09-14 LAB — IGA: IgA: 168 mg/dL (ref 68–378)

## 2017-09-14 NOTE — Progress Notes (Signed)
Subjective:    Patient ID: Krista Bean, female    DOB: Mar 25, 1966, 51 y.o.   MRN: [id number]  HPI Krista Bean is a pleasant 51 year old African-American female,   known to Dr. Fuller Plan from prior colonoscopy who was referred today by Dr. Jamal Maes for evaluation of elevated liver function studies. Patient has history of pulmonary hypertension, adult onset diabetes mellitus, end-stage renal disease for which she is status post renal transplant in 2008, this failed and she had a second renal transplant done in 2015.  She is maintained on tacrolimus.  Patient also has HIV diagnosed in 1997.  She is followed by [facility] Dellia [facility]ims at [facility].  She is maintained on Epivir, Ziagen, and raltegravir. She had upper abdominal ultrasound done in July 2018 which showed no gallstones, CBD of 3.5 mm, there was mild increased heterogeneity of the liver. She has recently had hepatitis B and C serologies which were negative, ANA negative, CMV, BK and EBV serologies all negative October 2018.  Her tacrolimus level was 3.9 in October. Labs from October 2018 T bili 0.3, alk phos 295, AST of 76, and ALT of 91. June 2018 alk phos 168, AST 66 and ALT 86. Patient has absolutely no GI symptoms and says she has been feeling well.  She is not aware of any prior history of liver disease, there is no family history of liver disease that she is aware of.  She is a nondrinker.  She says she has been on the same medication regimen for a long time though does have her Prograf dosages changed fairly frequently.  She has been on her same regimen for HIV long-term.  Review of Systems;Pertinent positive and negative review of systems were noted in the above HPI section.  All other review of systems was otherwise negative.  Outpatient Encounter Medications as of 09/14/2017  Medication Sig  . abacavir (ZIAGEN) 300 MG tablet Take 300 mg by mouth 2 (two) times daily. Reported on 03/05/2016  . ALPRAZolam (XANAX) 0.25 MG tablet Take  1 tablet (0.25 mg total) by mouth at bedtime as needed. Reported on 10/30/2015  . aspirin EC 81 MG tablet Take 81 mg by mouth.  . calcium acetate (PHOSLO) 667 MG capsule Take 2 capsules by mouth Three times a day after meals.  . furosemide (LASIX) 20 MG tablet Reported on 10/30/2015  . hydrALAZINE (APRESOLINE) 50 MG tablet Take 50 mg by mouth daily. Reported on 03/05/2016  . labetalol (NORMODYNE) 200 MG tablet TAKE 1 TABLET (200 MG TOTAL) BY MOUTH 2 TIMES DAILY.  Marland Kitchen lamiVUDine (EPIVIR) 150 MG tablet Take 150 mg by mouth daily.   . metoprolol (LOPRESSOR) 100 MG tablet Take 50 mg by mouth 2 (two) times daily. Reported on 03/05/2016  . mycophenolate (MYFORTIC) 360 MG TBEC EC tablet Take 360 mg by mouth 2 (two) times daily.  . phosphorus (K-PHOS-NEUTRAL) 155-852-130 MG tablet Take 500 mg by mouth 2 (two) times daily.   . predniSONE (DELTASONE) 5 MG tablet Take 7.5 mg by mouth daily.   . raltegravir (ISENTRESS) 400 MG tablet TAKE 1 TABLET (400 MG TOTAL) BY MOUTH 2 TIMES DAILY.  Marland Kitchen sulfamethoxazole-trimethoprim (BACTRIM DS) 800-160 MG per tablet Take 1 tablet by mouth daily.  . tacrolimus (PROGRAF) 1 MG capsule Take 5 mg by mouth 2 (two) times daily.   . [DISCONTINUED] glucose blood (ONE TOUCH ULTRA TEST) test strip 1 each by Other route 2 (two) times daily. And lancets 2/day  . [DISCONTINUED] mirabegron ER (MYRBETRIQ) 50  MG TB24 tablet Take 50 mg by mouth daily.   No facility-administered encounter medications on file as of 09/14/2017.    Allergies  Allergen Reactions  . Ciprofloxacin Hives  . Vicodin [Hydrocodone-Acetaminophen] Other (See Comments)    Delusions    Patient Active Problem List   Diagnosis Date Noted  . S/P hip replacement, right 08/04/2017  . Sleep difficulties 03/19/2016  . Type 2 diabetes mellitus ([place]) 03/05/2016  . Anxiety 06/09/2014  . Gout 09/13/2013  . End stage renal disease ([place]) 06/29/2012  . Avascular necrosis of femoral head ([place]) 09/13/2011  . Pulmonary  hypertension ([place]) 09/09/2011  . KIDNEY TRANSPLANTATION, HX OF 12/08/2007  . PERIPHERAL NEUROPATHY, FEET 12/28/2006  . HIV (human immunodeficiency virus infection) ([place]) 07/21/2006   Social History   Socioeconomic History  . Marital status: Divorced    Spouse name: Not on file  . Number of children: Not on file  . Years of education: Not on file  . Highest education level: Not on file  Social Needs  . Financial resource strain: Not on file  . Food insecurity - worry: Not on file  . Food insecurity - inability: Not on file  . Transportation needs - medical: Not on file  . Transportation needs - non-medical: Not on file  Occupational History  . Not on file  Tobacco Use  . Smoking status: Never Smoker  . Smokeless tobacco: Never Used  Substance and Sexual Activity  . Alcohol use: No    Alcohol/week: 0.0 oz  . Drug use: No  . Sexual activity: Not on file  Other Topics Concern  . Not on file  Social History Narrative  . Not on file    Ms. Krista Bean family history includes Diabetes in her father.      Objective:    Vitals:   09/14/17 1044  BP: 112/68  Pulse: 72    Physical Exam well-developed African-American female in no acute distress, pleasant, blood pressure 112/68, pulse 72, weight 175, height 5 foot 7, BMI 27.4.  HEENT; nontraumatic normocephalic EOMI PERRLA sclera anicteric, Cardiovascular; regular rate and rhythm with S1-S2 no murmur rub or gallop, Pulmonary; clear bilaterally, Abdomen; soft, bowel sounds are present no palpable mass or hepatosplenomegaly he does have transplanted kidneys palpable in both lower mid quadrants.  Rectal ;exam not done, Extremities; no clubbing cyanosis or edema skin warm and dry, Neuro ;psych mood and affect appropriate       Assessment & Plan:   #48 51 year old African-American female with persistently elevated liver function studies in setting of HIV, and chronic antiretroviral use, and history of 2 previous renal transplants on  chronic tacrolimus. Etiology of elevated LFTs is at this point unclear with workup to date negative for hepatitis B, hepatitis C, CMV, EBV, and previous ANA negative. Prior ultrasound did show some mild increased heterogeneity of the liver-probable steatosis Will need to rule out other autoimmune, metabolic and inherited forms of liver disease. 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  #2-HIV-on therapy with undetectable viral load per patient #3 status post renal transplant x2, last done August 2015-maintained on tacrolimus #4 hypertension #5.  Adult onset diabetes mellitus #6.  Colon cancer surveillance-up-to-date with colonoscopy last in January 2017 with one hyperplastic polyp found   Plan; repeat hepatic panel today, check pro time, ceruloplasmin, antimitochondrial antibody, anti-smooth muscle antibody, alpha-1 antitrypsin level, hepatitis A serology,  TTG and IgA. Scheduled for CT of the abdomen with contrast Discussed potential need for liver biopsy with patient which she is agreeable to if necessary. Patient has follow-up with her infectious disease specialist later this week, and is asked to discuss the elevated LFTs with him as well. Further plans pending results of above.  If all of the above unrevealing, we will plan to schedule for ultrasound-guided liver biopsy. I discussed case with Dr. Fuller Plan today.Glade Nurse Thorp PA-C 09/14/2017   Cc: Jamal Maes, MD

## 2017-09-14 NOTE — Patient Instructions (Signed)
Please go to the basement level to have your labs drawn.   You have been scheduled for a CT scan of the abdomen and pelvis at [facility] ([address]---this is in the same building as Press [facility]).   You are scheduled on Thursday 09-24-2017 at 9:00 am. You should arrive at 8:45 am  to your appointment time for registration. Please follow the written instructions below on the day of your exam:  WARNING: IF YOU ARE ALLERGIC TO IODINE/X-RAY DYE, PLEASE NOTIFY RADIOLOGY IMMEDIATELY AT [phone]! YOU WILL BE GIVEN A 13 HOUR PREMEDICATION PREP.  1) Do not eat  anything after 7:00 am (4 hours prior to your test) 2) You have been given 2 bottles of oral contrast to drink. The solution may taste               better if refrigerated, but do NOT add ice or any other liquid to this solution. Shake             well before drinking.    Drink 1 bottle of contrast @ 8:00 (1 hours prior to your exam)   You may take any medications as prescribed with a small amount of water except for the following: Metformin, Glucophage, Glucovance, Avandamet, Riomet, Fortamet, Actoplus Met, Janumet, Glumetza or Metaglip. The above medications must be held the day of the exam AND 48 hours after the exam.  The purpose of you drinking the oral contrast is to aid in the visualization of your intestinal tract. The contrast solution may cause some diarrhea. Before your exam is started, you will be given a small amount of fluid to drink. Depending on your individual set of symptoms, you may also receive an intravenous injection of x-ray contrast/dye. Plan on being at [facility] for 30 minutes or long, depending on the type of exam you are having performed.  If you have any questions regarding your exam or if you need to reschedule, you may call the CT department at [phone] between the hours of 8:00 am and 5:00 pm,  Monday-Friday.  ________________________________________________________________________

## 2017-09-14 NOTE — Progress Notes (Unsigned)
tissue

## 2017-09-14 NOTE — Progress Notes (Signed)
Reviewed and agree with management plan.  Audreena Sachdeva T. Kahlee Metivier, MD FACG 

## 2017-09-15 LAB — MITOCHONDRIAL/SMOOTH MUSCLE AB PNL
Mitochondrial Ab: 11.8 Units (ref 0.0–20.0)
SMOOTH MUSCLE AB: 17 U (ref 0–19)

## 2017-09-16 LAB — HEPATITIS A ANTIBODY, TOTAL: Hepatitis A AB,Total: NONREACTIVE

## 2017-09-16 LAB — ANTI-SMOOTH MUSCLE ANTIBODY, IGG: Actin (Smooth Muscle) Antibody (IGG): 21 U — ABNORMAL HIGH (ref <20)

## 2017-09-16 LAB — CERULOPLASMIN: CERULOPLASMIN: 43 mg/dL (ref 18–53)

## 2017-09-16 LAB — ALPHA-1-ANTITRYPSIN: A1 ANTITRYPSIN SER: 163 mg/dL (ref 83–199)

## 2017-09-16 LAB — HEPATITIS A ANTIBODY, IGM: Hep A IgM: NONREACTIVE

## 2017-09-23 ENCOUNTER — Ambulatory Visit: Payer: Medicare Other | Admitting: Internal Medicine

## 2017-09-23 ENCOUNTER — Encounter: Payer: Self-pay | Admitting: Internal Medicine

## 2017-09-23 VITALS — BP 140/82 | HR 92 | Temp 98.7°F | Ht 67.0 in | Wt 179.0 lb

## 2017-09-23 DIAGNOSIS — R0609 Other forms of dyspnea: Secondary | ICD-10-CM | POA: Diagnosis not present

## 2017-09-23 DIAGNOSIS — R011 Cardiac murmur, unspecified: Secondary | ICD-10-CM | POA: Diagnosis not present

## 2017-09-23 DIAGNOSIS — J019 Acute sinusitis, unspecified: Secondary | ICD-10-CM

## 2017-09-23 DIAGNOSIS — R0602 Shortness of breath: Secondary | ICD-10-CM | POA: Insufficient documentation

## 2017-09-23 MED ORDER — AZITHROMYCIN 250 MG PO TABS: ORAL_TABLET | ORAL | 1 refills | Status: DC

## 2017-09-23 MED ORDER — HYDROCODONE-HOMATROPINE 5-1.5 MG/5ML PO SYRP: 5.0000 mL | ORAL_SOLUTION | Freq: Four times a day (QID) | ORAL | 0 refills | Status: DC | PRN

## 2017-09-23 NOTE — Assessment & Plan Note (Signed)
Mild to mod, for antibx course,  to f/u any worsening symptoms or concerns 

## 2017-09-23 NOTE — Assessment & Plan Note (Signed)
Chest exam benign, I think possibly systems related to valvular issue, declines f/u labs today as well

## 2017-09-23 NOTE — Progress Notes (Signed)
Subjective:    Patient ID: Krista Bean, female    DOB: [birth year], 51 y.o.   MRN: [id number]  HPI   Here with 2-3 days acute onset fever, facial pain, pressure, headache, general weakness and malaise, and greenish d/c, with mild ST and cough, but pt denies chest pain, wheezing, orthopnea, PND, increased LE swelling, palpitations, dizziness or syncope.  Has hx of long standing heart murmur but no prior echo, and c/o several months significant gradually worsening doe, in fact has to sit exhausted in the car after getting the snow off the roof.  Past Medical History:  Diagnosis Date  . Allergy    seasonal  . Blood transfusion without reported diagnosis   . ESRD (end stage renal disease) ([place])   . Heart murmur   . HIV infection ([place])   . Hyperglycemia   . Peripheral neuropathy    Feet  . Renal failure    Hx. of Kidney transplantation   Past Surgical History:  Procedure Laterality Date  . BREAST REDUCTION SURGERY    . HEMORROIDECTOMY     unsure of year but thinks 80  . KIDNEY TRANSPLANT Left 05-2014  . KIDNEY TRANSPLANT Right 2010  . SHUNTOGRAM N/A 07/01/2012   Procedure: Earney Mallet;  Surgeon: Conrad [place], MD;  Location: [facility] CATH LAB;  Service: Cardiovascular;  Laterality: N/A;  . TOTAL HIP ARTHROPLASTY Right 11/2016  . Transposition AVF  Feb. 22, 2011   Right  by Dr. Rosalia Hammers  . TUBAL LIGATION  1993    reports that  has never smoked. she has never used smokeless tobacco. She reports that she does not drink alcohol or use drugs. family history includes Diabetes in her father. Allergies  Allergen Reactions  . Ciprofloxacin Hives  . Vicodin [Hydrocodone-Acetaminophen] Other (See Comments)    Delusions    Current Outpatient Medications on File Prior to Visit  Medication Sig Dispense Refill  . abacavir (ZIAGEN) 300 MG tablet Take 300 mg by mouth 2 (two) times daily. Reported on 03/05/2016    . ALPRAZolam (XANAX) 0.25 MG tablet Take 1 tablet (0.25 mg total) by mouth at bedtime  as needed. Reported on 10/30/2015 30 tablet 0  . aspirin EC 81 MG tablet Take 81 mg by mouth.    . calcium acetate (PHOSLO) 667 MG capsule Take 2 capsules by mouth Three times a day after meals.    . furosemide (LASIX) 20 MG tablet Reported on 10/30/2015    . hydrALAZINE (APRESOLINE) 50 MG tablet Take 50 mg by mouth daily. Reported on 03/05/2016    . labetalol (NORMODYNE) 200 MG tablet TAKE 1 TABLET (200 MG TOTAL) BY MOUTH 2 TIMES DAILY.    Marland Kitchen lamiVUDine (EPIVIR) 150 MG tablet Take 150 mg by mouth daily.     . metoprolol (LOPRESSOR) 100 MG tablet Take 50 mg by mouth 2 (two) times daily. Reported on 03/05/2016    . mycophenolate (MYFORTIC) 360 MG TBEC EC tablet Take 360 mg by mouth 2 (two) times daily.    . phosphorus (K-PHOS-NEUTRAL) 155-852-130 MG tablet Take 500 mg by mouth 2 (two) times daily.     . predniSONE (DELTASONE) 5 MG tablet Take 7.5 mg by mouth daily.     . raltegravir (ISENTRESS) 400 MG tablet TAKE 1 TABLET (400 MG TOTAL) BY MOUTH 2 TIMES DAILY.    Marland Kitchen sulfamethoxazole-trimethoprim (BACTRIM DS) 800-160 MG per tablet Take 1 tablet by mouth daily.    . tacrolimus (PROGRAF) 1 MG capsule Take 5 mg  by mouth 2 (two) times daily.      No current facility-administered medications on file prior to visit.    Review of Systems  Constitutional: Negative for other unusual diaphoresis or sweats HENT: Negative for ear discharge or swelling Eyes: Negative for other worsening visual disturbances Respiratory: Negative for stridor or other swelling  Gastrointestinal: Negative for worsening distension or other blood Genitourinary: Negative for retention or other urinary change Musculoskeletal: Negative for other MSK pain or swelling Skin: Negative for color change or other new lesions Neurological: Negative for worsening tremors and other numbness  Psychiatric/Behavioral: Negative for worsening agitation or other fatigue All other system neg per pt    Objective:   Physical Exam BP 140/82   Pulse  92   Temp 98.7 F (37.1 C) (Oral)   Ht 5\' 7"  (1.702 m)   Wt 179 lb (81.2 kg)   SpO2 99%   BMI 28.04 kg/m  VS noted, mild ill Constitutional: Pt appears in NAD HENT: Head: NCAT.  Right Ear: External ear normal.  Left Ear: External ear normal.  Eyes: . Pupils are equal, round, and reactive to light. Conjunctivae and EOM are normal Nose: without d/c or deformity Bilat tm's with mild erythema.  Max sinus areas mild tender.  Pharynx with mild erythema, no exudate Neck: Neck supple. Gross normal ROM Cardiovascular: Normal rate and regular rhythm.  but with grade 1-8/2 systolic murmur, worse RUSB Pulmonary/Chest: Effort normal and breath sounds without rales or wheezing.  Neurological: Pt is alert. At baseline orientation, motor grossly intact Skin: Skin is warm. No rashes, other new lesions, no LE edema Psychiatric: Pt behavior is normal without agitation  No other exam findings Lab Results  Component Value Date   WBC 6.5 04/13/2012   HGB 12.1 02/11/2017   HCT 41.0 01/12/2016   PLT 156 04/13/2012   GLUCOSE 90 09/14/2017   CHOL 164 11/16/2006   TRIG 200 (H) 11/16/2006   HDL 37 (L) 11/16/2006   LDLCALC 87 11/16/2006   ALT 88 (H) 09/14/2017   AST 59 (H) 09/14/2017   NA 139 09/14/2017   K 4.4 09/14/2017   CL 111 09/14/2017   CREATININE 3.07 (H) 09/14/2017   BUN 43 (H) 09/14/2017   CO2 15 (L) 09/14/2017   INR 1.0 09/14/2017   HGBA1C 5.3 08/05/2017      Assessment & Plan:

## 2017-09-23 NOTE — Patient Instructions (Addendum)
Please take all new medication as prescribed - the antibiotic and cough medicine  Please continue all other medications as before, and refills have been done if requested.  Please have the pharmacy call with any other refills you may need.  Please keep your appointments with your specialists as you may have planned  You will be contacted regarding the referral for: Echocardiogram

## 2017-09-23 NOTE — Assessment & Plan Note (Signed)
Maybe now symptomatic - for echo, consider cardiology referral

## 2017-09-24 ENCOUNTER — Telehealth (HOSPITAL_COMMUNITY): Payer: Self-pay | Admitting: Internal Medicine

## 2017-09-24 ENCOUNTER — Ambulatory Visit (INDEPENDENT_AMBULATORY_CARE_PROVIDER_SITE_OTHER)
Admission: RE | Admit: 2017-09-24 | Discharge: 2017-09-24 | Disposition: A | Discharge status: Home / Self Care | Payer: Medicare Other | Source: Ambulatory Visit | Attending: Physician Assistant | Admitting: Physician Assistant

## 2017-09-24 DIAGNOSIS — R945 Abnormal results of liver function studies: Secondary | ICD-10-CM | POA: Diagnosis not present

## 2017-09-24 DIAGNOSIS — Z940 Kidney transplant status: Secondary | ICD-10-CM | POA: Diagnosis not present

## 2017-09-24 DIAGNOSIS — R7989 Other specified abnormal findings of blood chemistry: Secondary | ICD-10-CM | POA: Diagnosis not present

## 2017-09-24 DIAGNOSIS — R0600 Dyspnea, unspecified: Secondary | ICD-10-CM

## 2017-09-24 DIAGNOSIS — B20 Human immunodeficiency virus [HIV] disease: Secondary | ICD-10-CM

## 2017-09-24 NOTE — Telephone Encounter (Signed)
Copied from [place]. Topic: Appointment Scheduling - Scheduling Inquiry for Clinic >> Sep 24, 2017  1:37 PM Bea Graff, NT wrote: Reason for CRM: Patient states she received a call to get labs done from Dr. Jenny Reichmann, but do not see labs that he ordered. She has made an appt for an Echo. Please advise.

## 2017-09-24 NOTE — Telephone Encounter (Signed)
I thought we had decided her most recent labs were good enough, but I can add orders if she is willing

## 2017-09-25 ENCOUNTER — Other Ambulatory Visit: Payer: Self-pay

## 2017-09-25 DIAGNOSIS — R7989 Other specified abnormal findings of blood chemistry: Secondary | ICD-10-CM

## 2017-09-25 DIAGNOSIS — R945 Abnormal results of liver function studies: Secondary | ICD-10-CM

## 2017-09-29 ENCOUNTER — Other Ambulatory Visit: Payer: Self-pay | Admitting: Internal Medicine

## 2017-09-29 ENCOUNTER — Ambulatory Visit (HOSPITAL_COMMUNITY): Payer: Medicare Other | Attending: Cardiology

## 2017-09-29 ENCOUNTER — Other Ambulatory Visit: Payer: Self-pay

## 2017-09-29 ENCOUNTER — Encounter: Payer: Self-pay | Admitting: Internal Medicine

## 2017-09-29 DIAGNOSIS — R0609 Other forms of dyspnea: Secondary | ICD-10-CM

## 2017-09-29 DIAGNOSIS — E1122 Type 2 diabetes mellitus with diabetic chronic kidney disease: Secondary | ICD-10-CM | POA: Insufficient documentation

## 2017-09-29 DIAGNOSIS — N186 End stage renal disease: Secondary | ICD-10-CM | POA: Insufficient documentation

## 2017-09-29 DIAGNOSIS — Z940 Kidney transplant status: Secondary | ICD-10-CM | POA: Insufficient documentation

## 2017-09-29 DIAGNOSIS — I080 Rheumatic disorders of both mitral and aortic valves: Secondary | ICD-10-CM | POA: Diagnosis not present

## 2017-09-29 DIAGNOSIS — R011 Cardiac murmur, unspecified: Secondary | ICD-10-CM | POA: Insufficient documentation

## 2017-09-29 DIAGNOSIS — I351 Nonrheumatic aortic (valve) insufficiency: Secondary | ICD-10-CM

## 2017-09-29 DIAGNOSIS — I272 Pulmonary hypertension, unspecified: Secondary | ICD-10-CM | POA: Diagnosis not present

## 2017-09-29 DIAGNOSIS — R0600 Dyspnea, unspecified: Secondary | ICD-10-CM | POA: Diagnosis present

## 2017-09-30 ENCOUNTER — Telehealth: Payer: Self-pay

## 2017-09-30 NOTE — Telephone Encounter (Signed)
Pt has been informed and expressed understanding.  

## 2017-09-30 NOTE — Telephone Encounter (Signed)
-----   Message from Biagio Borg, MD sent at 09/29/2017  8:15 PM EST ----- Left message on MyChart, pt to cont same tx except  The test results show that your current treatment is OK, except there is evidence for 2 valves on the left side (main side) of the heart that has some leakage.  The overall heart function was OK, but given your symptoms, we should refer you to Cardiology.  You should hear soon from the office.Redmond Baseman to please inform pt, I will do referral

## 2017-10-02 ENCOUNTER — Ambulatory Visit: Payer: Self-pay

## 2017-10-02 ENCOUNTER — Ambulatory Visit: Payer: Medicare Other | Admitting: Family

## 2017-10-02 ENCOUNTER — Encounter: Payer: Self-pay | Admitting: Family

## 2017-10-02 VITALS — BP 116/70 | HR 72 | Temp 98.8°F | Ht 67.0 in | Wt 174.0 lb

## 2017-10-02 DIAGNOSIS — M542 Cervicalgia: Secondary | ICD-10-CM | POA: Diagnosis not present

## 2017-10-02 MED ORDER — METHOCARBAMOL 500 MG PO TABS: 500.0000 mg | ORAL_TABLET | Freq: Three times a day (TID) | ORAL | 0 refills | Status: DC | PRN

## 2017-10-02 NOTE — Telephone Encounter (Signed)
  Reason for Disposition . [1] MODERATE neck pain (e.g., interferes with normal activities AND [2] present > 3 days  Answer Assessment - Initial Assessment Questions 1. ONSET: "When did the pain begin?"      Started 1 week ago 2. LOCATION: "Where does it hurt?"      Left side in the back down to shoulder 3. PATTERN "Does the pain come and go, or has it been constant since it started?"      Constant 4. SEVERITY: "How bad is the pain?"  (Scale 1-10; or mild, moderate, severe)   - MILD (1-3): doesn't interfere with normal activities    - MODERATE (4-7): interferes with normal activities or awakens from sleep    - SEVERE (8-10):  excruciating pain, unable to do any normal activities      5 5. RADIATION: "Does the pain go anywhere else, shoot into your arms?"     No 6. CORD SYMPTOMS: "Any weakness or numbness of the arms or legs?"     No 7. CAUSE: "What do you think is causing the neck pain?"     Unsure 8. NECK OVERUSE: "Any recent activities that involved turning or twisting the neck?"     No 9. OTHER SYMPTOMS: "Do you have any other symptoms?" (e.g., headache, fever, chest pain, difficulty breathing, neck swelling)     No 10. PREGNANCY: "Is there any chance you are pregnant?" "When was your last menstrual period?"       No  Protocols used: NECK PAIN OR STIFFNESS-A-AH States heat does not help pain. Appointment made.

## 2017-10-02 NOTE — Progress Notes (Signed)
Krista Bean is a 51 y.o. female with the following history as recorded in EpicCare:  Patient Active Problem List   Diagnosis Date Noted  . Cardiac murmur 09/23/2017  . DOE (dyspnea on exertion) 09/23/2017  . Acute sinus infection 09/23/2017  . S/P hip replacement, right 08/04/2017  . Sleep difficulties 03/19/2016  . Type 2 diabetes mellitus ([place]) 03/05/2016  . Anxiety 06/09/2014  . Gout 09/13/2013  . End stage renal disease ([place]) 06/29/2012  . Avascular necrosis of femoral head ([place]) 09/13/2011  . Pulmonary hypertension ([place]) 09/09/2011  . KIDNEY TRANSPLANTATION, HX OF 12/08/2007  . PERIPHERAL NEUROPATHY, FEET 12/28/2006  . HIV (human immunodeficiency virus infection) ([place]) 07/21/2006    Current Outpatient Medications  Medication Sig Dispense Refill  . abacavir (ZIAGEN) 300 MG tablet Take 300 mg by mouth 2 (two) times daily. Reported on 03/05/2016    . ALPRAZolam (XANAX) 0.25 MG tablet Take 1 tablet (0.25 mg total) by mouth at bedtime as needed. Reported on 10/30/2015 30 tablet 0  . aspirin EC 81 MG tablet Take 81 mg by mouth.    . calcium acetate (PHOSLO) 667 MG capsule Take 2 capsules by mouth Three times a day after meals.    . furosemide (LASIX) 20 MG tablet Reported on 10/30/2015    . hydrALAZINE (APRESOLINE) 50 MG tablet Take 50 mg by mouth daily. Reported on 03/05/2016    . labetalol (NORMODYNE) 200 MG tablet TAKE 1 TABLET (200 MG TOTAL) BY MOUTH 2 TIMES DAILY.    Marland Kitchen lamiVUDine (EPIVIR) 150 MG tablet Take 150 mg by mouth daily.     . metoprolol (LOPRESSOR) 100 MG tablet Take 50 mg by mouth 2 (two) times daily. Reported on 03/05/2016    . mycophenolate (MYFORTIC) 360 MG TBEC EC tablet Take 360 mg by mouth 2 (two) times daily.    . phosphorus (K-PHOS-NEUTRAL) 155-852-130 MG tablet Take 500 mg by mouth 2 (two) times daily.     . predniSONE (DELTASONE) 5 MG tablet Take 7.5 mg by mouth daily.     . raltegravir (ISENTRESS) 400 MG tablet TAKE 1 TABLET (400 MG TOTAL) BY MOUTH 2 TIMES  DAILY.    Marland Kitchen sulfamethoxazole-trimethoprim (BACTRIM DS) 800-160 MG per tablet Take 1 tablet by mouth daily.    . tacrolimus (PROGRAF) 1 MG capsule Take 5 mg by mouth 2 (two) times daily.     . methocarbamol (ROBAXIN) 500 MG tablet Take 1 tablet (500 mg total) by mouth every 8 (eight) hours as needed for muscle spasms. 20 tablet 0   No current facility-administered medications for this visit.     Allergies: Ciprofloxacin and Vicodin [hydrocodone-acetaminophen]  Past Medical History:  Diagnosis Date  . Allergy    seasonal  . Blood transfusion without reported diagnosis   . ESRD (end stage renal disease) ([place])   . Heart murmur   . HIV infection ([place])   . Hyperglycemia   . Peripheral neuropathy    Feet  . Renal failure    Hx. of Kidney transplantation    Past Surgical History:  Procedure Laterality Date  . BREAST REDUCTION SURGERY    . HEMORROIDECTOMY     unsure of year but thinks 80  . KIDNEY TRANSPLANT Left 05-2014  . KIDNEY TRANSPLANT Right 2010  . SHUNTOGRAM N/A 07/01/2012   Procedure: Earney Mallet;  Surgeon: Conrad New Eucha, MD;  Location: [facility] CATH LAB;  Service: Cardiovascular;  Laterality: N/A;  . TOTAL HIP ARTHROPLASTY Right 11/2016  . Transposition AVF  Feb. 22,  2011   Right  by Dr. Rosalia Hammers  . TUBAL LIGATION  1993    Family History  Problem Relation Age of Onset  . Diabetes Father   . Colon cancer Neg Hx   . Colon polyps Neg Hx   . Esophageal cancer Neg Hx   . Rectal cancer Neg Hx   . Stomach cancer Neg Hx     Social History   Tobacco Use  . Smoking status: Never Smoker  . Smokeless tobacco: Never Used  Substance Use Topics  . Alcohol use: No    Alcohol/week: 0.0 oz    Subjective:  Patient complaining of persisting neck pain x 3-4 weeks; denies any injury or trauma to the neck; denies any numbness/ tingling radiating into fingertips; describes a "soreness" in the back of head; denies any changes in vision; notices a tightness in upper back/ shoulders with  movement; thought might be related to her pillows- no improvement with changing pillows; does occasionally feel dizzy but notes this has been better since being on antibiotics for recent sinus infection;    Objective:  Vitals:   10/02/17 1417  BP: 116/70  Pulse: 72  Temp: 98.8 F (37.1 C)  TempSrc: Oral  SpO2: 99%  Weight: 174 lb (78.9 kg)  Height: 5\' 7"  (1.702 m)    General: Well developed, well nourished, in no acute distress  Skin : Warm and dry.  Head: Normocephalic and atraumatic  Eyes: Sclera and conjunctiva clear; pupils round and reactive to light; extraocular movements intact  Lungs: Respirations unlabored;  Musculoskeletal: No deformities; no active joint inflammation  Extremities: No edema, cyanosis, clubbing  Vessels: Symmetric bilaterally  Neurologic: Alert and oriented; speech intact; face symmetrical; moves all extremities well; CNII-XII intact without focal deficit   Assessment:  1. Acute neck pain     Plan:  Suspect muscular; do not feel imaging needed at this time; trial of Robaxin 500 mg tid prn; apply heat and rest; follow-up worse, no better.   No Follow-up on file.  No orders of the defined types were placed in this encounter.   Requested Prescriptions   Signed Prescriptions Disp Refills  . methocarbamol (ROBAXIN) 500 MG tablet 20 tablet 0    Sig: Take 1 tablet (500 mg total) by mouth every 8 (eight) hours as needed for muscle spasms.

## 2017-10-22 DIAGNOSIS — N390 Urinary tract infection, site not specified: Secondary | ICD-10-CM | POA: Diagnosis not present

## 2017-10-22 DIAGNOSIS — Z940 Kidney transplant status: Secondary | ICD-10-CM | POA: Diagnosis not present

## 2017-10-22 DIAGNOSIS — D631 Anemia in chronic kidney disease: Secondary | ICD-10-CM | POA: Diagnosis not present

## 2017-10-23 ENCOUNTER — Encounter: Payer: Self-pay | Admitting: Internal Medicine

## 2017-10-23 ENCOUNTER — Ambulatory Visit: Payer: Medicare Other | Admitting: Internal Medicine

## 2017-10-23 ENCOUNTER — Encounter (INDEPENDENT_AMBULATORY_CARE_PROVIDER_SITE_OTHER): Payer: Self-pay

## 2017-10-23 ENCOUNTER — Telehealth (HOSPITAL_COMMUNITY): Payer: Self-pay

## 2017-10-23 VITALS — BP 130/75 | HR 83 | Resp 16 | Ht 66.0 in | Wt 173.0 lb

## 2017-10-23 DIAGNOSIS — R0602 Shortness of breath: Secondary | ICD-10-CM | POA: Diagnosis not present

## 2017-10-23 DIAGNOSIS — I129 Hypertensive chronic kidney disease with stage 1 through stage 4 chronic kidney disease, or unspecified chronic kidney disease: Secondary | ICD-10-CM | POA: Diagnosis not present

## 2017-10-23 DIAGNOSIS — Z940 Kidney transplant status: Secondary | ICD-10-CM

## 2017-10-23 DIAGNOSIS — D631 Anemia in chronic kidney disease: Secondary | ICD-10-CM | POA: Diagnosis not present

## 2017-10-23 DIAGNOSIS — N2581 Secondary hyperparathyroidism of renal origin: Secondary | ICD-10-CM | POA: Diagnosis not present

## 2017-10-23 DIAGNOSIS — I351 Nonrheumatic aortic (valve) insufficiency: Secondary | ICD-10-CM

## 2017-10-23 DIAGNOSIS — E119 Type 2 diabetes mellitus without complications: Secondary | ICD-10-CM | POA: Diagnosis not present

## 2017-10-23 NOTE — Patient Instructions (Signed)
Your physician has requested that you have a lexiscan myoview. For further information please visit [email or web address]. Please follow instruction sheet, as given.  Your physician recommends that you schedule a follow-up appointment with Dr. Hilty after your stress test.   

## 2017-10-23 NOTE — Telephone Encounter (Signed)
Encounter complete. 

## 2017-10-24 ENCOUNTER — Encounter: Payer: Self-pay | Admitting: Internal Medicine

## 2017-10-24 DIAGNOSIS — I351 Nonrheumatic aortic (valve) insufficiency: Secondary | ICD-10-CM | POA: Insufficient documentation

## 2017-10-24 NOTE — Progress Notes (Signed)
OFFICE CONSULT NOTE  Chief Complaint:  Shortness of breath  Primary Care Physician: Binnie Rail, MD  HPI:  Krista Bean is a 52 y.o. female who is being seen today for the evaluation of dyspnea at the request of Hessie, Varone, MD. This is a pleasant 52 yo female with a history of ESRD s/p renal transplant x 2 (2008, 2016), murmur, HIV infection and hip replacement. She recently noted her heart was pounding and racing. She was short of breath but denied chest pain. She gets short of breath with exertion, recently with very minimal exertion and cannot exercise as she could several months ago. She reports having had a stress test at [facility] prior to her 1st kidney transplant, but not with her recent one in 2016. No family history of CAD is noted. Dr. Jenny Reichmann got a recent echo which showed an LVEF of 55-60%, normal wall motion, grade 1DD and moderate AI with mild MR. His is concerned her symptoms may be related to her AI.  PMHx:  Past Medical History:  Diagnosis Date  . Allergy    seasonal  . Blood transfusion without reported diagnosis   . ESRD (end stage renal disease) ([place])   . Heart murmur   . HIV infection ([place])   . Hyperglycemia   . Peripheral neuropathy    Feet  . Renal failure    Hx. of Kidney transplantation    Past Surgical History:  Procedure Laterality Date  . BREAST REDUCTION SURGERY    . HEMORROIDECTOMY     unsure of year but thinks 52  . KIDNEY TRANSPLANT Left 05-2014  . KIDNEY TRANSPLANT Right 2010  . SHUNTOGRAM N/A 07/01/2012   Procedure: Earney Mallet;  Surgeon: Conrad Belfield, MD;  Location: [facility] CATH LAB;  Service: Cardiovascular;  Laterality: N/A;  . TOTAL HIP ARTHROPLASTY Right 11/2016  . Transposition AVF  Feb. 22, 2011   Right  by Dr. Rosalia Hammers  . TUBAL LIGATION  1993    FAMHx:  Family History  Problem Relation Age of Onset  . Diabetes Father   . Colon cancer Neg Hx   . Colon polyps Neg Hx   . Esophageal cancer Neg Hx   . Rectal cancer Neg Hx   .  Stomach cancer Neg Hx     SOCHx:   reports that  has never smoked. she has never used smokeless tobacco. She reports that she does not drink alcohol or use drugs.  ALLERGIES:  Allergies  Allergen Reactions  . Ciprofloxacin Hives  . Vicodin [Hydrocodone-Acetaminophen] Other (See Comments)    Delusions     ROS: Pertinent items noted in HPI and remainder of comprehensive ROS otherwise negative.  HOME MEDS: Current Outpatient Medications on File Prior to Visit  Medication Sig Dispense Refill  . abacavir (ZIAGEN) 300 MG tablet Take 300 mg by mouth 2 (two) times daily. Reported on 03/05/2016    . ALPRAZolam (XANAX) 0.25 MG tablet Take 1 tablet (0.25 mg total) by mouth at bedtime as needed. Reported on 10/30/2015 30 tablet 0  . aspirin EC 81 MG tablet Take 81 mg by mouth.    . calcium acetate (PHOSLO) 667 MG capsule Take 2 capsules by mouth Three times a day after meals.    . furosemide (LASIX) 20 MG tablet Reported on 10/30/2015    . hydrALAZINE (APRESOLINE) 50 MG tablet Take 50 mg by mouth daily. Reported on 03/05/2016    . labetalol (NORMODYNE) 200 MG tablet TAKE 1 TABLET (200  MG TOTAL) BY MOUTH 2 TIMES DAILY.    Marland Kitchen lamiVUDine (EPIVIR) 150 MG tablet Take 150 mg by mouth daily.     . methocarbamol (ROBAXIN) 500 MG tablet Take 1 tablet (500 mg total) by mouth every 8 (eight) hours as needed for muscle spasms. 20 tablet 0  . metoprolol (LOPRESSOR) 100 MG tablet Take 50 mg by mouth 2 (two) times daily. Reported on 03/05/2016    . mycophenolate (MYFORTIC) 360 MG TBEC EC tablet Take 360 mg by mouth 2 (two) times daily.    . phosphorus (K-PHOS-NEUTRAL) 155-852-130 MG tablet Take 500 mg by mouth 2 (two) times daily.     . predniSONE (DELTASONE) 5 MG tablet Take 7.5 mg by mouth daily.     . raltegravir (ISENTRESS) 400 MG tablet TAKE 1 TABLET (400 MG TOTAL) BY MOUTH 2 TIMES DAILY.    Marland Kitchen sulfamethoxazole-trimethoprim (BACTRIM DS) 800-160 MG per tablet Take 1 tablet by mouth daily.    . tacrolimus  (PROGRAF) 1 MG capsule Take 5 mg by mouth 2 (two) times daily.      No current facility-administered medications on file prior to visit.     LABS/IMAGING: No results found for this or any previous visit (from the past 48 hour(s)). No results found.  LIPID PANEL:    Component Value Date/Time   CHOL 164 11/16/2006 2107   TRIG 200 (H) 11/16/2006 2107   HDL 37 (L) 11/16/2006 2107   CHOLHDL 4.4 Ratio 11/16/2006 2107   VLDL 40 11/16/2006 2107   LDLCALC 87 11/16/2006 2107    WEIGHTS: Wt Readings from Last 3 Encounters:  10/23/17 173 lb (78.5 kg)  10/02/17 174 lb (78.9 kg)  09/23/17 179 lb (81.2 kg)    VITALS: BP 130/75   Pulse 83   Resp 16   Ht 5\' 6"  (1.676 m)   Wt 173 lb (78.5 kg)   SpO2 100%   BMI 27.92 kg/m   EXAM: General appearance: alert and no distress Neck: no carotid bruit, no JVD and thyroid not enlarged, symmetric, no tenderness/mass/nodules Lungs: clear to auscultation bilaterally Heart: regular rate and rhythm, S1, S2 normal, systolic murmur: early systolic 2/6, blowing at apex and diastolic murmur: mid diastolic 2/6, decrescendo at lower left sternal border Abdomen: soft, non-tender; bowel sounds normal; no masses,  no organomegaly Extremities: extremities normal, atraumatic, no cyanosis or edema Pulses: 2+ and symmetric Skin: Skin color, texture, turgor normal. No rashes or lesions Neurologic: Grossly normal Psych: Pleasant  EKG: NSR At 78, voltage for LVH - personally reviewed  ASSESSMENT: 1. Progressive DOE 2. Moderate AI and mild MR, LVEF 55-60% (09/2017) 3. HIV (undetectable viral load) 4. ESRD s/p renal transplant x 2 (2008, 2016)  PLAN: 1.   Krista Bean is describing fatigue and progressive exercise intolerance. It is possible that this could be related to her AI, however, at best it is moderate and she has a narrow pulse pressure. I'm more concerned that her dyspnea could be an anginal equivalent. She probably developed her ESRD secondary to  hypertension or HIV - these risk factors would put her at increased risk for CAD.  I'd recommend we start with a lexiscan myoview - if negative and her symptoms persist, we could then consider TEE to closer evaluate her AI. If her Brantley Fling is abnormal, we can evaluate her coronaries and the valve simultaneously at cath.  Thanks for the kind referral - I will keep you informed about her test results.  Pixie Casino, MD, [facility], FACP  [place] Director of the [facility]  [facility] Diplomate of the American Board of Clinical Lipidology Attending Cardiologist  Direct Dial: [phone]  Fax: [phone]  Website:  [email or web address] 10/24/2017, 5:44 PM

## 2017-10-26 DIAGNOSIS — Z779 Other contact with and (suspected) exposures hazardous to health: Secondary | ICD-10-CM | POA: Diagnosis not present

## 2017-10-26 DIAGNOSIS — Z1231 Encounter for screening mammogram for malignant neoplasm of breast: Secondary | ICD-10-CM | POA: Diagnosis not present

## 2017-10-26 DIAGNOSIS — Z01419 Encounter for gynecological examination (general) (routine) without abnormal findings: Secondary | ICD-10-CM | POA: Diagnosis not present

## 2017-10-28 ENCOUNTER — Ambulatory Visit (HOSPITAL_COMMUNITY)
Admission: RE | Admit: 2017-10-28 | Discharge: 2017-10-28 | Disposition: A | Discharge status: Home / Self Care | Payer: Medicare Other | Source: Ambulatory Visit | Attending: Cardiology | Admitting: Cardiology

## 2017-10-28 DIAGNOSIS — Z940 Kidney transplant status: Secondary | ICD-10-CM | POA: Insufficient documentation

## 2017-10-28 DIAGNOSIS — R0602 Shortness of breath: Secondary | ICD-10-CM | POA: Diagnosis not present

## 2017-10-28 LAB — MYOCARDIAL PERFUSION IMAGING
CHL CUP NUCLEAR SSS: 2
CHL CUP RESTING HR STRESS: 74 {beats}/min
LVDIAVOL: 105 mL (ref 46–106)
LVSYSVOL: 35 mL
NUC STRESS TID: 0.93
Peak HR: 89 {beats}/min
SDS: 2
SRS: 0

## 2017-10-28 MED ORDER — TECHNETIUM TC 99M TETROFOSMIN IV KIT
9.8000 | PACK | Freq: Once | INTRAVENOUS | Status: AC | PRN
  Administered 2017-10-28: 9.8 via INTRAVENOUS
  Filled 2017-10-28: qty 10

## 2017-10-28 MED ORDER — REGADENOSON 0.4 MG/5ML IV SOLN
0.4000 mg | Freq: Once | INTRAVENOUS | Status: AC
  Administered 2017-10-28: 0.4 mg via INTRAVENOUS

## 2017-10-28 MED ORDER — TECHNETIUM TC 99M TETROFOSMIN IV KIT
31.1000 | PACK | Freq: Once | INTRAVENOUS | Status: AC | PRN
  Administered 2017-10-28: 31.1 via INTRAVENOUS
  Filled 2017-10-28: qty 32

## 2017-10-29 ENCOUNTER — Other Ambulatory Visit: Payer: Self-pay | Admitting: Obstetrics and Gynecology

## 2017-10-29 DIAGNOSIS — R928 Other abnormal and inconclusive findings on diagnostic imaging of breast: Secondary | ICD-10-CM

## 2017-11-02 ENCOUNTER — Encounter (HOSPITAL_COMMUNITY): Payer: Medicare Other

## 2017-11-03 DIAGNOSIS — D899 Disorder involving the immune mechanism, unspecified: Secondary | ICD-10-CM | POA: Diagnosis not present

## 2017-11-03 DIAGNOSIS — I129 Hypertensive chronic kidney disease with stage 1 through stage 4 chronic kidney disease, or unspecified chronic kidney disease: Secondary | ICD-10-CM | POA: Diagnosis not present

## 2017-11-03 DIAGNOSIS — Z4822 Encounter for aftercare following kidney transplant: Secondary | ICD-10-CM | POA: Diagnosis not present

## 2017-11-03 DIAGNOSIS — Z5181 Encounter for therapeutic drug level monitoring: Secondary | ICD-10-CM | POA: Diagnosis not present

## 2017-11-03 DIAGNOSIS — Z79899 Other long term (current) drug therapy: Secondary | ICD-10-CM | POA: Diagnosis not present

## 2017-11-03 DIAGNOSIS — N184 Chronic kidney disease, stage 4 (severe): Secondary | ICD-10-CM | POA: Diagnosis not present

## 2017-11-03 DIAGNOSIS — I10 Essential (primary) hypertension: Secondary | ICD-10-CM | POA: Diagnosis not present

## 2017-11-06 ENCOUNTER — Other Ambulatory Visit: Payer: Medicare Other

## 2017-11-16 ENCOUNTER — Ambulatory Visit
Admission: RE | Admit: 2017-11-16 | Discharge: 2017-11-16 | Disposition: A | Discharge status: Home / Self Care | Payer: Medicare Other | Source: Ambulatory Visit | Attending: Obstetrics and Gynecology | Admitting: Obstetrics and Gynecology

## 2017-11-16 DIAGNOSIS — N6310 Unspecified lump in the right breast, unspecified quadrant: Secondary | ICD-10-CM | POA: Diagnosis not present

## 2017-11-16 DIAGNOSIS — R928 Other abnormal and inconclusive findings on diagnostic imaging of breast: Secondary | ICD-10-CM

## 2017-11-17 DIAGNOSIS — D631 Anemia in chronic kidney disease: Secondary | ICD-10-CM | POA: Diagnosis not present

## 2017-11-17 DIAGNOSIS — Z940 Kidney transplant status: Secondary | ICD-10-CM | POA: Diagnosis not present

## 2017-11-17 DIAGNOSIS — N390 Urinary tract infection, site not specified: Secondary | ICD-10-CM | POA: Diagnosis not present

## 2017-11-19 DIAGNOSIS — Z940 Kidney transplant status: Secondary | ICD-10-CM | POA: Diagnosis not present

## 2017-11-23 DIAGNOSIS — N2581 Secondary hyperparathyroidism of renal origin: Secondary | ICD-10-CM | POA: Diagnosis not present

## 2017-11-23 DIAGNOSIS — I129 Hypertensive chronic kidney disease with stage 1 through stage 4 chronic kidney disease, or unspecified chronic kidney disease: Secondary | ICD-10-CM | POA: Diagnosis not present

## 2017-11-23 DIAGNOSIS — E119 Type 2 diabetes mellitus without complications: Secondary | ICD-10-CM | POA: Diagnosis not present

## 2017-11-23 DIAGNOSIS — D631 Anemia in chronic kidney disease: Secondary | ICD-10-CM | POA: Diagnosis not present

## 2017-11-23 DIAGNOSIS — Z940 Kidney transplant status: Secondary | ICD-10-CM | POA: Diagnosis not present

## 2017-11-24 DIAGNOSIS — N870 Mild cervical dysplasia: Secondary | ICD-10-CM | POA: Diagnosis not present

## 2017-11-24 DIAGNOSIS — R87610 Atypical squamous cells of undetermined significance on cytologic smear of cervix (ASC-US): Secondary | ICD-10-CM | POA: Diagnosis not present

## 2017-11-25 ENCOUNTER — Telehealth: Payer: Self-pay

## 2017-11-25 DIAGNOSIS — R7989 Other specified abnormal findings of blood chemistry: Secondary | ICD-10-CM

## 2017-11-25 DIAGNOSIS — R945 Abnormal results of liver function studies: Principal | ICD-10-CM

## 2017-11-25 NOTE — Telephone Encounter (Signed)
Labs entered Left message for patient to call back

## 2017-11-25 NOTE — Telephone Encounter (Signed)
-----   Message from Ladene Artist, MD sent at 11/24/2017  4:08 PM EST ----- Received an office note from Dr. Lorrene Reid regarding status of elevated LFT evaluation. Pt saw AE in early 09/2017.  Pt has blood work that needs to be completed: LFTs, tTG, IgA She also needs  Fe, TIBC, ferritin sent After these return she will likely need a liver biopsy.

## 2017-11-26 NOTE — Telephone Encounter (Signed)
Her decision is noted. The transplant MD almost certainly will require the hepatic evaluation to be complete to proceed with transplant consideration.

## 2017-11-26 NOTE — Telephone Encounter (Signed)
Patient notified of the recommendations She wants to wait on labs until she sees the transplant MD and Dr. Lorrene Reid again.  She states she will call me back if she wants to proceed with workup.

## 2017-12-04 DIAGNOSIS — N261 Atrophy of kidney (terminal): Secondary | ICD-10-CM | POA: Diagnosis not present

## 2017-12-04 DIAGNOSIS — D702 Other drug-induced agranulocytosis: Secondary | ICD-10-CM | POA: Diagnosis not present

## 2017-12-04 DIAGNOSIS — E872 Acidosis: Secondary | ICD-10-CM | POA: Diagnosis not present

## 2017-12-04 DIAGNOSIS — Z4822 Encounter for aftercare following kidney transplant: Secondary | ICD-10-CM | POA: Diagnosis not present

## 2017-12-04 DIAGNOSIS — Z940 Kidney transplant status: Secondary | ICD-10-CM | POA: Diagnosis not present

## 2017-12-04 DIAGNOSIS — Z96649 Presence of unspecified artificial hip joint: Secondary | ICD-10-CM | POA: Diagnosis not present

## 2017-12-04 DIAGNOSIS — Z79899 Other long term (current) drug therapy: Secondary | ICD-10-CM | POA: Diagnosis not present

## 2017-12-04 DIAGNOSIS — R1010 Upper abdominal pain, unspecified: Secondary | ICD-10-CM | POA: Diagnosis not present

## 2017-12-04 DIAGNOSIS — D8989 Other specified disorders involving the immune mechanism, not elsewhere classified: Secondary | ICD-10-CM | POA: Diagnosis not present

## 2017-12-04 DIAGNOSIS — D649 Anemia, unspecified: Secondary | ICD-10-CM | POA: Diagnosis not present

## 2017-12-04 DIAGNOSIS — N3644 Muscular disorders of urethra: Secondary | ICD-10-CM | POA: Diagnosis not present

## 2017-12-04 DIAGNOSIS — R7989 Other specified abnormal findings of blood chemistry: Secondary | ICD-10-CM | POA: Diagnosis not present

## 2017-12-04 DIAGNOSIS — Z87440 Personal history of urinary (tract) infections: Secondary | ICD-10-CM | POA: Diagnosis not present

## 2017-12-04 DIAGNOSIS — Z792 Long term (current) use of antibiotics: Secondary | ICD-10-CM | POA: Diagnosis not present

## 2017-12-04 DIAGNOSIS — I10 Essential (primary) hypertension: Secondary | ICD-10-CM | POA: Diagnosis not present

## 2017-12-04 DIAGNOSIS — Z7952 Long term (current) use of systemic steroids: Secondary | ICD-10-CM | POA: Diagnosis not present

## 2017-12-04 DIAGNOSIS — D72819 Decreased white blood cell count, unspecified: Secondary | ICD-10-CM | POA: Diagnosis not present

## 2017-12-04 DIAGNOSIS — E119 Type 2 diabetes mellitus without complications: Secondary | ICD-10-CM | POA: Diagnosis not present

## 2017-12-04 DIAGNOSIS — I7090 Unspecified atherosclerosis: Secondary | ICD-10-CM | POA: Diagnosis not present

## 2017-12-17 DIAGNOSIS — I313 Pericardial effusion (noninflammatory): Secondary | ICD-10-CM | POA: Diagnosis not present

## 2017-12-17 DIAGNOSIS — R1011 Right upper quadrant pain: Secondary | ICD-10-CM | POA: Diagnosis not present

## 2017-12-17 DIAGNOSIS — R1012 Left upper quadrant pain: Secondary | ICD-10-CM | POA: Diagnosis not present

## 2017-12-17 DIAGNOSIS — Z940 Kidney transplant status: Secondary | ICD-10-CM | POA: Diagnosis not present

## 2017-12-22 DIAGNOSIS — R1032 Left lower quadrant pain: Secondary | ICD-10-CM | POA: Diagnosis not present

## 2017-12-22 DIAGNOSIS — R7989 Other specified abnormal findings of blood chemistry: Secondary | ICD-10-CM | POA: Diagnosis not present

## 2017-12-22 DIAGNOSIS — Z4822 Encounter for aftercare following kidney transplant: Secondary | ICD-10-CM | POA: Diagnosis not present

## 2017-12-22 DIAGNOSIS — R112 Nausea with vomiting, unspecified: Secondary | ICD-10-CM | POA: Diagnosis not present

## 2017-12-22 DIAGNOSIS — E119 Type 2 diabetes mellitus without complications: Secondary | ICD-10-CM | POA: Diagnosis not present

## 2017-12-22 DIAGNOSIS — I10 Essential (primary) hypertension: Secondary | ICD-10-CM | POA: Diagnosis not present

## 2017-12-22 DIAGNOSIS — T8619 Other complication of kidney transplant: Secondary | ICD-10-CM | POA: Diagnosis not present

## 2017-12-22 DIAGNOSIS — D8989 Other specified disorders involving the immune mechanism, not elsewhere classified: Secondary | ICD-10-CM | POA: Diagnosis not present

## 2017-12-22 DIAGNOSIS — R42 Dizziness and giddiness: Secondary | ICD-10-CM | POA: Diagnosis not present

## 2017-12-22 DIAGNOSIS — Z940 Kidney transplant status: Secondary | ICD-10-CM | POA: Diagnosis not present

## 2017-12-22 DIAGNOSIS — D72819 Decreased white blood cell count, unspecified: Secondary | ICD-10-CM | POA: Diagnosis not present

## 2017-12-22 DIAGNOSIS — R509 Fever, unspecified: Secondary | ICD-10-CM | POA: Diagnosis not present

## 2017-12-22 DIAGNOSIS — Z79899 Other long term (current) drug therapy: Secondary | ICD-10-CM | POA: Diagnosis not present

## 2017-12-22 DIAGNOSIS — E872 Acidosis: Secondary | ICD-10-CM | POA: Diagnosis not present

## 2017-12-22 DIAGNOSIS — N179 Acute kidney failure, unspecified: Secondary | ICD-10-CM | POA: Diagnosis not present

## 2017-12-22 DIAGNOSIS — I120 Hypertensive chronic kidney disease with stage 5 chronic kidney disease or end stage renal disease: Secondary | ICD-10-CM | POA: Diagnosis not present

## 2017-12-22 DIAGNOSIS — E860 Dehydration: Secondary | ICD-10-CM | POA: Diagnosis not present

## 2017-12-23 DIAGNOSIS — E872 Acidosis: Secondary | ICD-10-CM | POA: Diagnosis not present

## 2017-12-23 DIAGNOSIS — R42 Dizziness and giddiness: Secondary | ICD-10-CM | POA: Diagnosis not present

## 2017-12-23 DIAGNOSIS — E860 Dehydration: Secondary | ICD-10-CM | POA: Diagnosis not present

## 2017-12-23 DIAGNOSIS — I10 Essential (primary) hypertension: Secondary | ICD-10-CM | POA: Diagnosis not present

## 2017-12-23 DIAGNOSIS — R1032 Left lower quadrant pain: Secondary | ICD-10-CM | POA: Diagnosis not present

## 2017-12-23 DIAGNOSIS — R7989 Other specified abnormal findings of blood chemistry: Secondary | ICD-10-CM | POA: Diagnosis not present

## 2017-12-23 DIAGNOSIS — N179 Acute kidney failure, unspecified: Secondary | ICD-10-CM | POA: Diagnosis not present

## 2017-12-23 DIAGNOSIS — Z79899 Other long term (current) drug therapy: Secondary | ICD-10-CM | POA: Diagnosis not present

## 2017-12-23 DIAGNOSIS — T8619 Other complication of kidney transplant: Secondary | ICD-10-CM | POA: Diagnosis not present

## 2017-12-23 DIAGNOSIS — D72819 Decreased white blood cell count, unspecified: Secondary | ICD-10-CM | POA: Diagnosis not present

## 2017-12-23 DIAGNOSIS — R509 Fever, unspecified: Secondary | ICD-10-CM | POA: Diagnosis not present

## 2017-12-25 DIAGNOSIS — T8611 Kidney transplant rejection: Secondary | ICD-10-CM | POA: Diagnosis not present

## 2017-12-25 DIAGNOSIS — E119 Type 2 diabetes mellitus without complications: Secondary | ICD-10-CM | POA: Diagnosis not present

## 2017-12-25 DIAGNOSIS — N390 Urinary tract infection, site not specified: Secondary | ICD-10-CM | POA: Diagnosis not present

## 2017-12-25 DIAGNOSIS — Z792 Long term (current) use of antibiotics: Secondary | ICD-10-CM | POA: Diagnosis not present

## 2017-12-25 DIAGNOSIS — Z794 Long term (current) use of insulin: Secondary | ICD-10-CM | POA: Diagnosis not present

## 2017-12-25 DIAGNOSIS — I10 Essential (primary) hypertension: Secondary | ICD-10-CM | POA: Diagnosis not present

## 2017-12-25 DIAGNOSIS — Z4822 Encounter for aftercare following kidney transplant: Secondary | ICD-10-CM | POA: Diagnosis not present

## 2017-12-25 DIAGNOSIS — D899 Disorder involving the immune mechanism, unspecified: Secondary | ICD-10-CM | POA: Diagnosis not present

## 2017-12-25 DIAGNOSIS — Z940 Kidney transplant status: Secondary | ICD-10-CM | POA: Diagnosis not present

## 2017-12-25 DIAGNOSIS — Z79899 Other long term (current) drug therapy: Secondary | ICD-10-CM | POA: Diagnosis not present

## 2017-12-31 DIAGNOSIS — Z79899 Other long term (current) drug therapy: Secondary | ICD-10-CM | POA: Diagnosis not present

## 2017-12-31 DIAGNOSIS — Z940 Kidney transplant status: Secondary | ICD-10-CM | POA: Diagnosis not present

## 2018-01-15 ENCOUNTER — Other Ambulatory Visit: Payer: Self-pay | Admitting: Internal Medicine

## 2018-01-15 NOTE — Telephone Encounter (Signed)
[place] Controlled Substance Database checked. Last filled on 08/04/17

## 2018-02-04 DIAGNOSIS — D8989 Other specified disorders involving the immune mechanism, not elsewhere classified: Secondary | ICD-10-CM | POA: Diagnosis not present

## 2018-02-04 DIAGNOSIS — Z96649 Presence of unspecified artificial hip joint: Secondary | ICD-10-CM | POA: Diagnosis not present

## 2018-02-04 DIAGNOSIS — M87852 Other osteonecrosis, left femur: Secondary | ICD-10-CM | POA: Diagnosis not present

## 2018-02-04 DIAGNOSIS — M25552 Pain in left hip: Secondary | ICD-10-CM | POA: Diagnosis not present

## 2018-02-04 DIAGNOSIS — M87052 Idiopathic aseptic necrosis of left femur: Secondary | ICD-10-CM | POA: Diagnosis not present

## 2018-02-04 DIAGNOSIS — Z4822 Encounter for aftercare following kidney transplant: Secondary | ICD-10-CM | POA: Diagnosis not present

## 2018-02-04 DIAGNOSIS — D649 Anemia, unspecified: Secondary | ICD-10-CM | POA: Diagnosis not present

## 2018-02-04 DIAGNOSIS — M7062 Trochanteric bursitis, left hip: Secondary | ICD-10-CM | POA: Diagnosis not present

## 2018-02-04 DIAGNOSIS — D72819 Decreased white blood cell count, unspecified: Secondary | ICD-10-CM | POA: Diagnosis not present

## 2018-02-04 DIAGNOSIS — Z940 Kidney transplant status: Secondary | ICD-10-CM | POA: Diagnosis not present

## 2018-02-04 DIAGNOSIS — Z792 Long term (current) use of antibiotics: Secondary | ICD-10-CM | POA: Diagnosis not present

## 2018-02-04 DIAGNOSIS — Z79899 Other long term (current) drug therapy: Secondary | ICD-10-CM | POA: Diagnosis not present

## 2018-02-04 DIAGNOSIS — Z794 Long term (current) use of insulin: Secondary | ICD-10-CM | POA: Diagnosis not present

## 2018-02-04 DIAGNOSIS — Z7952 Long term (current) use of systemic steroids: Secondary | ICD-10-CM | POA: Diagnosis not present

## 2018-02-04 DIAGNOSIS — R945 Abnormal results of liver function studies: Secondary | ICD-10-CM | POA: Diagnosis not present

## 2018-02-04 DIAGNOSIS — R1900 Intra-abdominal and pelvic swelling, mass and lump, unspecified site: Secondary | ICD-10-CM | POA: Diagnosis not present

## 2018-02-04 DIAGNOSIS — Z96641 Presence of right artificial hip joint: Secondary | ICD-10-CM | POA: Diagnosis not present

## 2018-02-04 DIAGNOSIS — R109 Unspecified abdominal pain: Secondary | ICD-10-CM | POA: Diagnosis not present

## 2018-02-04 DIAGNOSIS — E872 Acidosis: Secondary | ICD-10-CM | POA: Diagnosis not present

## 2018-02-04 DIAGNOSIS — I10 Essential (primary) hypertension: Secondary | ICD-10-CM | POA: Diagnosis not present

## 2018-02-04 DIAGNOSIS — M87059 Idiopathic aseptic necrosis of unspecified femur: Secondary | ICD-10-CM | POA: Diagnosis not present

## 2018-02-04 DIAGNOSIS — N269 Renal sclerosis, unspecified: Secondary | ICD-10-CM | POA: Diagnosis not present

## 2018-02-04 DIAGNOSIS — E119 Type 2 diabetes mellitus without complications: Secondary | ICD-10-CM | POA: Diagnosis not present

## 2018-02-04 DIAGNOSIS — R1030 Lower abdominal pain, unspecified: Secondary | ICD-10-CM | POA: Diagnosis not present

## 2018-02-23 DIAGNOSIS — I129 Hypertensive chronic kidney disease with stage 1 through stage 4 chronic kidney disease, or unspecified chronic kidney disease: Secondary | ICD-10-CM | POA: Diagnosis not present

## 2018-02-23 DIAGNOSIS — Z940 Kidney transplant status: Secondary | ICD-10-CM | POA: Diagnosis not present

## 2018-02-23 DIAGNOSIS — N189 Chronic kidney disease, unspecified: Secondary | ICD-10-CM | POA: Diagnosis not present

## 2018-02-23 DIAGNOSIS — N390 Urinary tract infection, site not specified: Secondary | ICD-10-CM | POA: Diagnosis not present

## 2018-03-04 DIAGNOSIS — Z4822 Encounter for aftercare following kidney transplant: Secondary | ICD-10-CM | POA: Diagnosis not present

## 2018-03-30 DIAGNOSIS — D899 Disorder involving the immune mechanism, unspecified: Secondary | ICD-10-CM | POA: Diagnosis not present

## 2018-03-30 DIAGNOSIS — Z7952 Long term (current) use of systemic steroids: Secondary | ICD-10-CM | POA: Diagnosis not present

## 2018-03-30 DIAGNOSIS — E872 Acidosis: Secondary | ICD-10-CM | POA: Diagnosis not present

## 2018-03-30 DIAGNOSIS — Z79899 Other long term (current) drug therapy: Secondary | ICD-10-CM | POA: Diagnosis not present

## 2018-03-30 DIAGNOSIS — Z4822 Encounter for aftercare following kidney transplant: Secondary | ICD-10-CM | POA: Diagnosis not present

## 2018-03-30 DIAGNOSIS — R1010 Upper abdominal pain, unspecified: Secondary | ICD-10-CM | POA: Diagnosis not present

## 2018-03-30 DIAGNOSIS — Z794 Long term (current) use of insulin: Secondary | ICD-10-CM | POA: Diagnosis not present

## 2018-03-30 DIAGNOSIS — T8611 Kidney transplant rejection: Secondary | ICD-10-CM | POA: Diagnosis not present

## 2018-03-30 DIAGNOSIS — E1122 Type 2 diabetes mellitus with diabetic chronic kidney disease: Secondary | ICD-10-CM | POA: Diagnosis not present

## 2018-03-30 DIAGNOSIS — N184 Chronic kidney disease, stage 4 (severe): Secondary | ICD-10-CM | POA: Diagnosis not present

## 2018-03-30 DIAGNOSIS — D8989 Other specified disorders involving the immune mechanism, not elsewhere classified: Secondary | ICD-10-CM | POA: Diagnosis not present

## 2018-03-30 DIAGNOSIS — Z940 Kidney transplant status: Secondary | ICD-10-CM | POA: Diagnosis not present

## 2018-03-30 DIAGNOSIS — D702 Other drug-induced agranulocytosis: Secondary | ICD-10-CM | POA: Diagnosis not present

## 2018-03-30 DIAGNOSIS — I10 Essential (primary) hypertension: Secondary | ICD-10-CM | POA: Diagnosis not present

## 2018-04-28 DIAGNOSIS — Z4822 Encounter for aftercare following kidney transplant: Secondary | ICD-10-CM | POA: Diagnosis not present

## 2018-05-25 DIAGNOSIS — Z4822 Encounter for aftercare following kidney transplant: Secondary | ICD-10-CM | POA: Diagnosis not present

## 2018-05-26 DIAGNOSIS — Z4822 Encounter for aftercare following kidney transplant: Secondary | ICD-10-CM | POA: Diagnosis not present

## 2018-05-26 DIAGNOSIS — D702 Other drug-induced agranulocytosis: Secondary | ICD-10-CM | POA: Diagnosis not present

## 2018-05-26 DIAGNOSIS — Z79899 Other long term (current) drug therapy: Secondary | ICD-10-CM | POA: Diagnosis not present

## 2018-05-31 DIAGNOSIS — Z4822 Encounter for aftercare following kidney transplant: Secondary | ICD-10-CM | POA: Diagnosis not present

## 2018-06-29 DIAGNOSIS — Z792 Long term (current) use of antibiotics: Secondary | ICD-10-CM | POA: Diagnosis not present

## 2018-06-29 DIAGNOSIS — G4700 Insomnia, unspecified: Secondary | ICD-10-CM | POA: Diagnosis not present

## 2018-06-29 DIAGNOSIS — Z7952 Long term (current) use of systemic steroids: Secondary | ICD-10-CM | POA: Diagnosis not present

## 2018-06-29 DIAGNOSIS — E119 Type 2 diabetes mellitus without complications: Secondary | ICD-10-CM | POA: Diagnosis not present

## 2018-06-29 DIAGNOSIS — I10 Essential (primary) hypertension: Secondary | ICD-10-CM | POA: Diagnosis not present

## 2018-06-29 DIAGNOSIS — E872 Acidosis: Secondary | ICD-10-CM | POA: Diagnosis not present

## 2018-06-29 DIAGNOSIS — Z4822 Encounter for aftercare following kidney transplant: Secondary | ICD-10-CM | POA: Diagnosis not present

## 2018-06-29 DIAGNOSIS — Z940 Kidney transplant status: Secondary | ICD-10-CM | POA: Diagnosis not present

## 2018-06-29 DIAGNOSIS — D702 Other drug-induced agranulocytosis: Secondary | ICD-10-CM | POA: Diagnosis not present

## 2018-06-29 DIAGNOSIS — D899 Disorder involving the immune mechanism, unspecified: Secondary | ICD-10-CM | POA: Diagnosis not present

## 2018-06-29 DIAGNOSIS — Z79899 Other long term (current) drug therapy: Secondary | ICD-10-CM | POA: Diagnosis not present

## 2018-07-12 ENCOUNTER — Telehealth: Payer: Self-pay | Admitting: Internal Medicine

## 2018-07-12 NOTE — Telephone Encounter (Signed)
Please advise 

## 2018-07-12 NOTE — Telephone Encounter (Signed)
Copied from [place] [phone]. Topic: Quick Communication - See Telephone Encounter >> Jul 12, 2018  1:01 PM Hewitt Shorts wrote: Pt is wanitng to change her PCP from Dr. Celso Amy to Dr. Grier Mitts   Best number is [phone]

## 2018-07-12 NOTE — Telephone Encounter (Signed)
Ok with me 

## 2018-07-13 DIAGNOSIS — Z940 Kidney transplant status: Secondary | ICD-10-CM | POA: Diagnosis not present

## 2018-07-14 DIAGNOSIS — E119 Type 2 diabetes mellitus without complications: Secondary | ICD-10-CM | POA: Diagnosis not present

## 2018-07-14 LAB — HM DIABETES EYE EXAM

## 2018-07-16 NOTE — Telephone Encounter (Signed)
I am ok with seeing the pt, however she should be advised in advance that this provider does not rx benzos (ativan, etc) so that she can make an informed decision in regards to choosing a new provider.

## 2018-07-19 NOTE — Telephone Encounter (Signed)
Left patient vm to call back in regard.

## 2018-08-12 DIAGNOSIS — R748 Abnormal levels of other serum enzymes: Secondary | ICD-10-CM | POA: Diagnosis not present

## 2018-08-12 DIAGNOSIS — M879 Osteonecrosis, unspecified: Secondary | ICD-10-CM | POA: Diagnosis not present

## 2018-08-12 DIAGNOSIS — D899 Disorder involving the immune mechanism, unspecified: Secondary | ICD-10-CM | POA: Diagnosis not present

## 2018-08-12 DIAGNOSIS — E872 Acidosis: Secondary | ICD-10-CM | POA: Diagnosis not present

## 2018-08-12 DIAGNOSIS — Z23 Encounter for immunization: Secondary | ICD-10-CM | POA: Diagnosis not present

## 2018-08-12 DIAGNOSIS — Z792 Long term (current) use of antibiotics: Secondary | ICD-10-CM | POA: Diagnosis not present

## 2018-08-12 DIAGNOSIS — Z940 Kidney transplant status: Secondary | ICD-10-CM | POA: Diagnosis not present

## 2018-08-12 DIAGNOSIS — E119 Type 2 diabetes mellitus without complications: Secondary | ICD-10-CM | POA: Diagnosis not present

## 2018-08-12 DIAGNOSIS — Z4822 Encounter for aftercare following kidney transplant: Secondary | ICD-10-CM | POA: Diagnosis not present

## 2018-08-12 DIAGNOSIS — D729 Disorder of white blood cells, unspecified: Secondary | ICD-10-CM | POA: Diagnosis not present

## 2018-08-12 DIAGNOSIS — I10 Essential (primary) hypertension: Secondary | ICD-10-CM | POA: Diagnosis not present

## 2018-08-12 DIAGNOSIS — D72819 Decreased white blood cell count, unspecified: Secondary | ICD-10-CM | POA: Diagnosis not present

## 2018-08-12 DIAGNOSIS — D649 Anemia, unspecified: Secondary | ICD-10-CM | POA: Diagnosis not present

## 2018-08-12 DIAGNOSIS — Z79899 Other long term (current) drug therapy: Secondary | ICD-10-CM | POA: Diagnosis not present

## 2018-08-12 DIAGNOSIS — G4700 Insomnia, unspecified: Secondary | ICD-10-CM | POA: Diagnosis not present

## 2018-08-19 DIAGNOSIS — I129 Hypertensive chronic kidney disease with stage 1 through stage 4 chronic kidney disease, or unspecified chronic kidney disease: Secondary | ICD-10-CM | POA: Diagnosis not present

## 2018-08-19 DIAGNOSIS — D631 Anemia in chronic kidney disease: Secondary | ICD-10-CM | POA: Diagnosis not present

## 2018-08-19 DIAGNOSIS — N189 Chronic kidney disease, unspecified: Secondary | ICD-10-CM | POA: Diagnosis not present

## 2018-08-19 DIAGNOSIS — Z940 Kidney transplant status: Secondary | ICD-10-CM | POA: Diagnosis not present

## 2018-08-19 DIAGNOSIS — E785 Hyperlipidemia, unspecified: Secondary | ICD-10-CM | POA: Diagnosis not present

## 2018-08-19 DIAGNOSIS — N2581 Secondary hyperparathyroidism of renal origin: Secondary | ICD-10-CM | POA: Diagnosis not present

## 2018-08-23 NOTE — Progress Notes (Signed)
Subjective:    Patient ID: Krista Bean, female    DOB: 1966-03-15, 52 y.o.   MRN: [id number]  HPI The patient is here for an acute visit to discuss a referral to Psych..  She feels depressed.  She only feels down sometimes.  She thinks she feels more anxious. She worries a lot.  She takes xanax only as needed and it does help..  She has been on prolonged and has not taken it, but when she was taking it was helpful.  She would prefer not to take any more medication and wonders if she needs to talk to someone.  She did discuss this with 1 of her other doctors and she did tell him that she was not sleeping well and he discussed that this could be affecting her mood as well.  She does not sleep well.  This is not a new problem.  She has difficulty falling asleep and staying asleep.  She wakes up very easily-any noise will wake her up.  She does not feel tired during the day.  She thinks she gets 3-4 hours of sleep at night.     She is not currently exercising.    Medications and allergies reviewed with patient and updated if appropriate.  Patient Active Problem List   Diagnosis Date Noted  . Aortic valve regurgitation 10/24/2017  . Cardiac murmur 09/23/2017  . Shortness of breath 09/23/2017  . S/P hip replacement, right 08/04/2017  . Sleep difficulties 03/19/2016  . Type 2 diabetes mellitus ([place]) 03/05/2016  . Anxiety 06/09/2014  . Gout 09/13/2013  . End stage renal disease ([place]) 06/29/2012  . Avascular necrosis of femoral head ([place]) 09/13/2011  . Pulmonary hypertension ([place]) 09/09/2011  . KIDNEY TRANSPLANTATION, HX OF 12/08/2007  . PERIPHERAL NEUROPATHY, FEET 12/28/2006  . HIV (human immunodeficiency virus infection) ([place]) 07/21/2006    Current Outpatient Medications on File Prior to Visit  Medication Sig Dispense Refill  . abacavir (ZIAGEN) 300 MG tablet Take 300 mg by mouth 2 (two) times daily. Reported on 03/05/2016    . ALPRAZolam (XANAX) 0.25 MG tablet TAKE 1 TABLET AT  BEDTIME AS NEEDED FOR SLEEP 30 tablet 0  . aspirin EC 81 MG tablet Take 81 mg by mouth.    Marland Kitchen atorvastatin (LIPITOR) 10 MG tablet TAKE 1 TABLET BY MOUTH DAILY.    . calcium acetate (PHOSLO) 667 MG capsule Take 2 capsules by mouth Three times a day after meals.    . furosemide (LASIX) 20 MG tablet Reported on 10/30/2015    . hydrALAZINE (APRESOLINE) 50 MG tablet Take 50 mg by mouth daily. Reported on 03/05/2016    . labetalol (NORMODYNE) 200 MG tablet TAKE 1 TABLET (200 MG TOTAL) BY MOUTH 2 TIMES DAILY.    Marland Kitchen lamiVUDine (EPIVIR) 150 MG tablet Take 150 mg by mouth daily.     . methocarbamol (ROBAXIN) 500 MG tablet Take 1 tablet (500 mg total) by mouth every 8 (eight) hours as needed for muscle spasms. 20 tablet 0  . metoprolol (LOPRESSOR) 100 MG tablet Take 50 mg by mouth 2 (two) times daily. Reported on 03/05/2016    . mycophenolate (MYFORTIC) 360 MG TBEC EC tablet Take 360 mg by mouth 2 (two) times daily.    . phosphorus (K-PHOS-NEUTRAL) 155-852-130 MG tablet Take 500 mg by mouth 2 (two) times daily.     . predniSONE (DELTASONE) 5 MG tablet Take 1.5 mg by mouth daily.     . raltegravir (ISENTRESS) 400 MG  tablet TAKE 1 TABLET (400 MG TOTAL) BY MOUTH 2 TIMES DAILY.    Marland Kitchen sulfamethoxazole-trimethoprim (BACTRIM DS) 800-160 MG per tablet Take 1 tablet by mouth daily.    . tacrolimus (PROGRAF) 1 MG capsule Take 5 mg by mouth 2 (two) times daily.      No current facility-administered medications on file prior to visit.     Past Medical History:  Diagnosis Date  . Allergy    seasonal  . Blood transfusion without reported diagnosis   . ESRD (end stage renal disease) ([place])   . Heart murmur   . HIV infection ([place])   . Hyperglycemia   . Peripheral neuropathy    Feet  . Renal failure    Hx. of Kidney transplantation    Past Surgical History:  Procedure Laterality Date  . BREAST REDUCTION SURGERY    . HEMORROIDECTOMY     unsure of year but thinks 26  . KIDNEY TRANSPLANT Left 05-2014  . KIDNEY  TRANSPLANT Right 2010  . REDUCTION MAMMAPLASTY    . SHUNTOGRAM N/A 07/01/2012   Procedure: Earney Mallet;  Surgeon: Conrad Villas, MD;  Location[facility] C[facility] LAB;  Service: Cardiovascular;  Laterality: N/A;  . TOTAL HIP ARTHROPLASTY Right 11/2016  . Transposition AVF  Feb. 22, 2011   Right  by Dr. Rosalia Hammers  . TUBAL LIGATION  1993    Social History   Socioeconomic History  . Marital status: Divorced    Spouse name: Not on file  . Number of children: Not on file  . Years of education: Not on file  . Highest education level: Not on file  Occupational History  . Not on file  Social Needs  . Financial resource strain: Not on file  . Food insecurity:    Worry: Not on file    Inability: Not on file  . Transportation needs:    Medical: Not on file    Non-medical: Not on file  Tobacco Use  . Smoking status: Never Smoker  . Smokeless tobacco: Never Used  Substance and Sexual Activity  . Alcohol use: No    Alcohol/week: 0.0 standard drinks  . Drug use: No  . Sexual activity: Not on file  Lifestyle  . Physical activity:    Days per week: Not on file    Minutes per session: Not on file  . Stress: Not on file  Relationships  . Social connections:    Talks on phone: Not on file    Gets together: Not on file    Attends religious service: Not on file    Active member of club or organization: Not on file    Attends meetings of clubs or organizations: Not on file    Relationship status: Not on file  Other Topics Concern  . Not on file  Social History Narrative  . Not on file    Family History  Problem Relation Age of Onset  . Diabetes Father   . Colon cancer Neg Hx   . Colon polyps Neg Hx   . Esophageal cancer Neg Hx   . Rectal cancer Neg Hx   . Stomach cancer Neg Hx     Review of Systems  Constitutional: Positive for fatigue (At the end of the day only). Negative for appetite change.  Psychiatric/Behavioral: Positive for dysphoric mood and sleep disturbance. Negative for  suicidal ideas. The patient is nervous/anxious.        Objective:   Vitals:   08/24/18 1327  BP: 128/64  Pulse: 75  Resp: 16  Temp: 98.2 F (36.8 C)  SpO2: 99%   BP Readings from Last 3 Encounters:  08/24/18 128/64  10/23/17 130/75  10/02/17 116/70   Wt Readings from Last 3 Encounters:  08/24/18 187 lb (84.8 kg)  10/28/17 173 lb (78.5 kg)  10/23/17 173 lb (78.5 kg)   Body mass index is 30.18 kg/m.   Physical Exam  Constitutional: She appears well-developed and well-nourished. No distress.  Skin: Skin is warm and dry. She is not diaphoretic.  Psychiatric: She has a normal mood and affect. Her behavior is normal. Judgment and thought content normal.           Assessment & Plan:    See Problem List for Assessment and Plan of chronic medical problems.

## 2018-08-24 ENCOUNTER — Encounter: Payer: Self-pay | Admitting: Internal Medicine

## 2018-08-24 ENCOUNTER — Ambulatory Visit (INDEPENDENT_AMBULATORY_CARE_PROVIDER_SITE_OTHER): Payer: Medicare Other | Admitting: Internal Medicine

## 2018-08-24 VITALS — BP 128/64 | HR 75 | Temp 98.2°F | Resp 16 | Ht 66.0 in | Wt 187.0 lb

## 2018-08-24 DIAGNOSIS — G479 Sleep disorder, unspecified: Secondary | ICD-10-CM | POA: Diagnosis not present

## 2018-08-24 DIAGNOSIS — F3289 Other specified depressive episodes: Secondary | ICD-10-CM

## 2018-08-24 DIAGNOSIS — F419 Anxiety disorder, unspecified: Secondary | ICD-10-CM

## 2018-08-24 DIAGNOSIS — F329 Major depressive disorder, single episode, unspecified: Secondary | ICD-10-CM | POA: Insufficient documentation

## 2018-08-24 DIAGNOSIS — F32A Depression, unspecified: Secondary | ICD-10-CM | POA: Insufficient documentation

## 2018-08-24 MED ORDER — ALPRAZOLAM 0.25 MG PO TABS: 0.2500 mg | ORAL_TABLET | Freq: Every evening | ORAL | 0 refills | Status: DC | PRN

## 2018-08-24 NOTE — Assessment & Plan Note (Signed)
Time she does feel depressed, but wonders if it is more related to increased anxiety She states she does not feel like she ever dealt with becoming HIV positive and wonders if that some of her depression/anxiety She would like to avoid medication, but is interested in speaking with a therapist Names and numbers given

## 2018-08-24 NOTE — Patient Instructions (Addendum)
[facility]  [facility] [place] [facility] [place]  [place], [place]  [facility] [place], [place], [place], [address] [phone] Office [phone] [facility]            [place], [place]     Try to start exercising.   Try the white noise machine at night.     Use the xanax only as needed - it was sent to your pharmacy

## 2018-08-24 NOTE — Assessment & Plan Note (Signed)
Some generalized anxiety that she seems to be able to control, occasional anxiety increases to the point where she has difficulty sleeping and cannot turn her brain off Takes Xanax at that time-only takes it as needed Medication works well and denies side effects We will continue Xanax as needed only-advised her to only take this as needed She understands that this medication is addictive She is interested in seeing the therapist-names and numbers given

## 2018-08-24 NOTE — Assessment & Plan Note (Signed)
Chronic Has difficulty falling asleep and staying asleep He is awoken easily anyways Currently exercising Discussed the importance of regular exercise She will also try a weight and was mentioning that she has, has never tried-hopefully that will prevent her from waking up multiple times throughout the night Since she is not fatigued during the day and functions without difficulty we will hold off on medication

## 2018-09-14 ENCOUNTER — Other Ambulatory Visit: Payer: Self-pay

## 2018-09-14 NOTE — Patient Outreach (Signed)
[facility]) Care Management  09/14/2018  Krista Bean 12/03/1965 [id number]   Medication Adherence call to Mrs. Krista Bean left a message for patient to call back patient is due on Atorvastatin 10 mg. Krista Bean is showing past due under [place].    [facility] Management Direct Dial [phone]  Fax [phone] [email or web address]

## 2018-09-16 DIAGNOSIS — Z4822 Encounter for aftercare following kidney transplant: Secondary | ICD-10-CM | POA: Diagnosis not present

## 2018-09-16 DIAGNOSIS — Z79899 Other long term (current) drug therapy: Secondary | ICD-10-CM | POA: Diagnosis not present

## 2018-10-20 DIAGNOSIS — R748 Abnormal levels of other serum enzymes: Secondary | ICD-10-CM | POA: Diagnosis not present

## 2018-10-20 DIAGNOSIS — E119 Type 2 diabetes mellitus without complications: Secondary | ICD-10-CM | POA: Diagnosis not present

## 2018-10-20 DIAGNOSIS — M8788 Other osteonecrosis, other site: Secondary | ICD-10-CM | POA: Diagnosis not present

## 2018-10-20 DIAGNOSIS — Z79899 Other long term (current) drug therapy: Secondary | ICD-10-CM | POA: Diagnosis not present

## 2018-10-20 DIAGNOSIS — Z940 Kidney transplant status: Secondary | ICD-10-CM | POA: Diagnosis not present

## 2018-10-20 DIAGNOSIS — E872 Acidosis: Secondary | ICD-10-CM | POA: Diagnosis not present

## 2018-10-20 DIAGNOSIS — I10 Essential (primary) hypertension: Secondary | ICD-10-CM | POA: Diagnosis not present

## 2018-10-20 DIAGNOSIS — G4700 Insomnia, unspecified: Secondary | ICD-10-CM | POA: Diagnosis not present

## 2018-10-20 DIAGNOSIS — Z4822 Encounter for aftercare following kidney transplant: Secondary | ICD-10-CM | POA: Diagnosis not present

## 2018-11-16 DIAGNOSIS — R05 Cough: Secondary | ICD-10-CM | POA: Diagnosis not present

## 2018-11-16 DIAGNOSIS — Z79899 Other long term (current) drug therapy: Secondary | ICD-10-CM | POA: Diagnosis not present

## 2018-11-16 DIAGNOSIS — E118 Type 2 diabetes mellitus with unspecified complications: Secondary | ICD-10-CM | POA: Diagnosis not present

## 2018-11-16 DIAGNOSIS — G4700 Insomnia, unspecified: Secondary | ICD-10-CM | POA: Diagnosis not present

## 2018-11-16 DIAGNOSIS — I10 Essential (primary) hypertension: Secondary | ICD-10-CM | POA: Diagnosis not present

## 2018-11-16 DIAGNOSIS — N390 Urinary tract infection, site not specified: Secondary | ICD-10-CM | POA: Diagnosis not present

## 2018-11-16 DIAGNOSIS — Z940 Kidney transplant status: Secondary | ICD-10-CM | POA: Diagnosis not present

## 2018-11-16 DIAGNOSIS — D899 Disorder involving the immune mechanism, unspecified: Secondary | ICD-10-CM | POA: Diagnosis not present

## 2018-11-16 DIAGNOSIS — D702 Other drug-induced agranulocytosis: Secondary | ICD-10-CM | POA: Diagnosis not present

## 2018-11-16 DIAGNOSIS — Z792 Long term (current) use of antibiotics: Secondary | ICD-10-CM | POA: Diagnosis not present

## 2018-11-16 DIAGNOSIS — D649 Anemia, unspecified: Secondary | ICD-10-CM | POA: Diagnosis not present

## 2018-11-16 DIAGNOSIS — Z4822 Encounter for aftercare following kidney transplant: Secondary | ICD-10-CM | POA: Diagnosis not present

## 2018-12-14 DIAGNOSIS — Z4822 Encounter for aftercare following kidney transplant: Secondary | ICD-10-CM | POA: Diagnosis not present

## 2018-12-16 DIAGNOSIS — D702 Other drug-induced agranulocytosis: Secondary | ICD-10-CM | POA: Diagnosis not present

## 2018-12-16 DIAGNOSIS — Z79899 Other long term (current) drug therapy: Secondary | ICD-10-CM | POA: Diagnosis not present

## 2018-12-16 DIAGNOSIS — Z4822 Encounter for aftercare following kidney transplant: Secondary | ICD-10-CM | POA: Diagnosis not present

## 2018-12-20 DIAGNOSIS — Z4822 Encounter for aftercare following kidney transplant: Secondary | ICD-10-CM | POA: Diagnosis not present

## 2019-01-07 ENCOUNTER — Encounter: Payer: Medicare Other | Admitting: Internal Medicine

## 2019-01-13 DIAGNOSIS — Z79899 Other long term (current) drug therapy: Secondary | ICD-10-CM | POA: Diagnosis not present

## 2019-01-13 DIAGNOSIS — Z4822 Encounter for aftercare following kidney transplant: Secondary | ICD-10-CM | POA: Diagnosis not present

## 2019-02-08 DIAGNOSIS — Z940 Kidney transplant status: Secondary | ICD-10-CM | POA: Diagnosis not present

## 2019-02-14 DIAGNOSIS — I120 Hypertensive chronic kidney disease with stage 5 chronic kidney disease or end stage renal disease: Secondary | ICD-10-CM | POA: Diagnosis not present

## 2019-02-14 DIAGNOSIS — E118 Type 2 diabetes mellitus with unspecified complications: Secondary | ICD-10-CM | POA: Diagnosis not present

## 2019-02-14 DIAGNOSIS — Z79899 Other long term (current) drug therapy: Secondary | ICD-10-CM | POA: Diagnosis not present

## 2019-02-14 DIAGNOSIS — R7989 Other specified abnormal findings of blood chemistry: Secondary | ICD-10-CM | POA: Diagnosis not present

## 2019-02-14 DIAGNOSIS — D899 Disorder involving the immune mechanism, unspecified: Secondary | ICD-10-CM | POA: Diagnosis not present

## 2019-02-14 DIAGNOSIS — D72819 Decreased white blood cell count, unspecified: Secondary | ICD-10-CM | POA: Diagnosis not present

## 2019-02-14 DIAGNOSIS — M879 Osteonecrosis, unspecified: Secondary | ICD-10-CM | POA: Diagnosis not present

## 2019-02-14 DIAGNOSIS — Z940 Kidney transplant status: Secondary | ICD-10-CM | POA: Diagnosis not present

## 2019-02-14 DIAGNOSIS — G4700 Insomnia, unspecified: Secondary | ICD-10-CM | POA: Diagnosis not present

## 2019-02-14 DIAGNOSIS — Z4822 Encounter for aftercare following kidney transplant: Secondary | ICD-10-CM | POA: Diagnosis not present

## 2019-02-14 DIAGNOSIS — E1122 Type 2 diabetes mellitus with diabetic chronic kidney disease: Secondary | ICD-10-CM | POA: Diagnosis not present

## 2019-02-14 DIAGNOSIS — D631 Anemia in chronic kidney disease: Secondary | ICD-10-CM | POA: Diagnosis not present

## 2019-02-14 DIAGNOSIS — E872 Acidosis: Secondary | ICD-10-CM | POA: Diagnosis not present

## 2019-02-14 DIAGNOSIS — Z792 Long term (current) use of antibiotics: Secondary | ICD-10-CM | POA: Diagnosis not present

## 2019-02-14 DIAGNOSIS — Z96641 Presence of right artificial hip joint: Secondary | ICD-10-CM | POA: Diagnosis not present

## 2019-02-14 DIAGNOSIS — I10 Essential (primary) hypertension: Secondary | ICD-10-CM | POA: Diagnosis not present

## 2019-02-14 DIAGNOSIS — Z7952 Long term (current) use of systemic steroids: Secondary | ICD-10-CM | POA: Diagnosis not present

## 2019-03-06 IMAGING — MG 2D DIGITAL DIAGNOSTIC UNILATERAL RIGHT MAMMOGRAM WITH CAD AND AD
6 of 9 series · 6 of 21 positions shown · non-contrast
Comparison: [DATE] [DATE], [DATE], [DATE] [DATE], [DATE], [DATE] [DATE], [DATE],
[DATE] [DATE], [DATE]

CLINICAL DATA: 51-year-old patient recalled from recent screening
2D mammogram for evaluation of a possible asymmetry in the far
lateral and superior right breast.

EXAM:
2D DIGITAL DIAGNOSTIC RIGHT MAMMOGRAM WITH CAD AND ADJUNCT TOMO
ULTRASOUND RIGHT BREAST

[R CC]
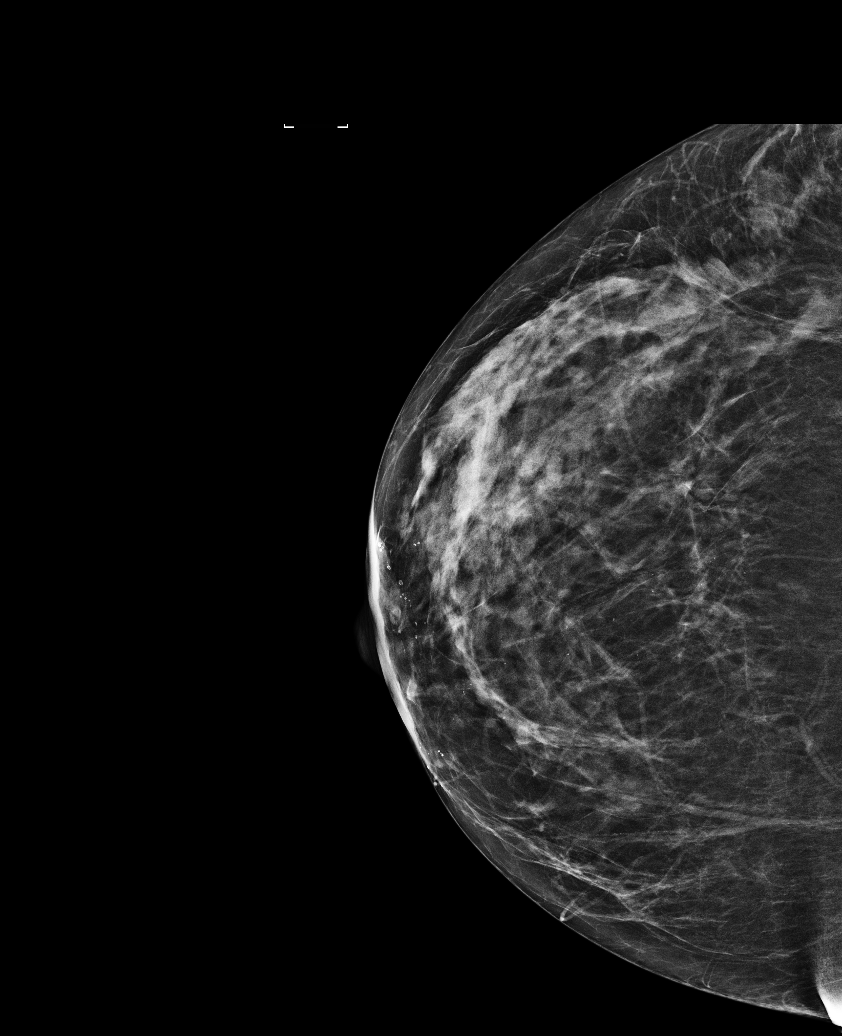

[R XCCL synth-2D]
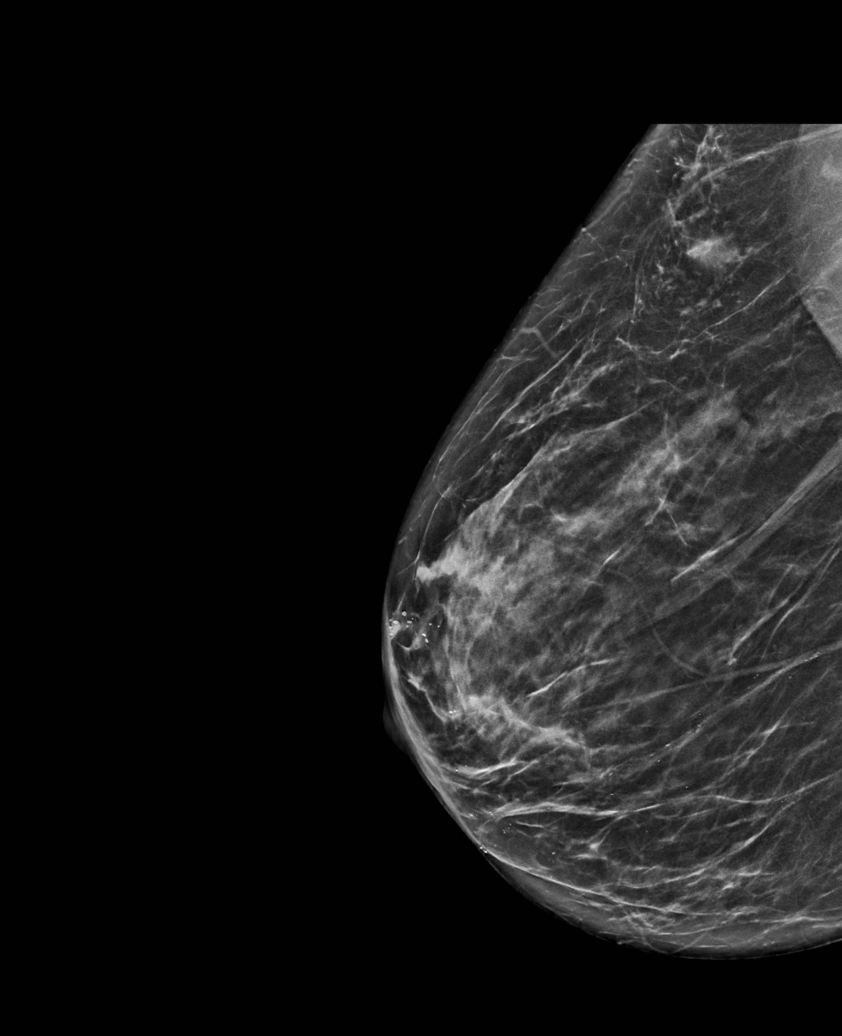

[R CC synth-2D]
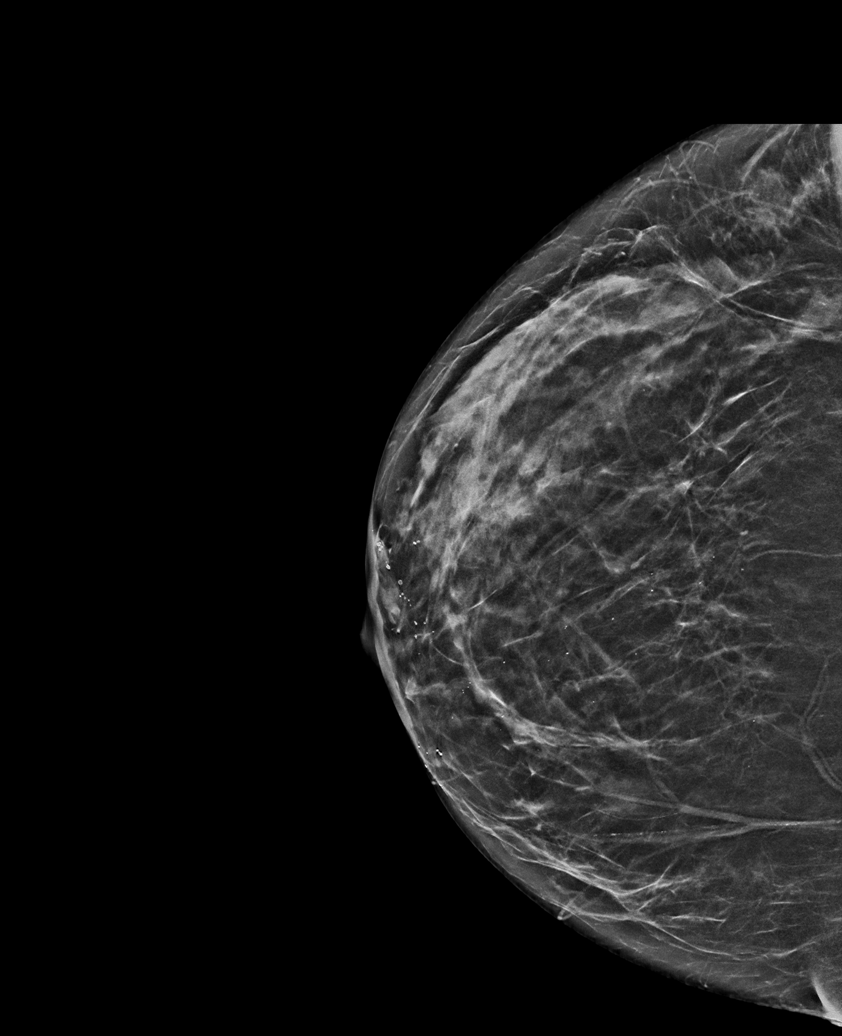

[R MLO]
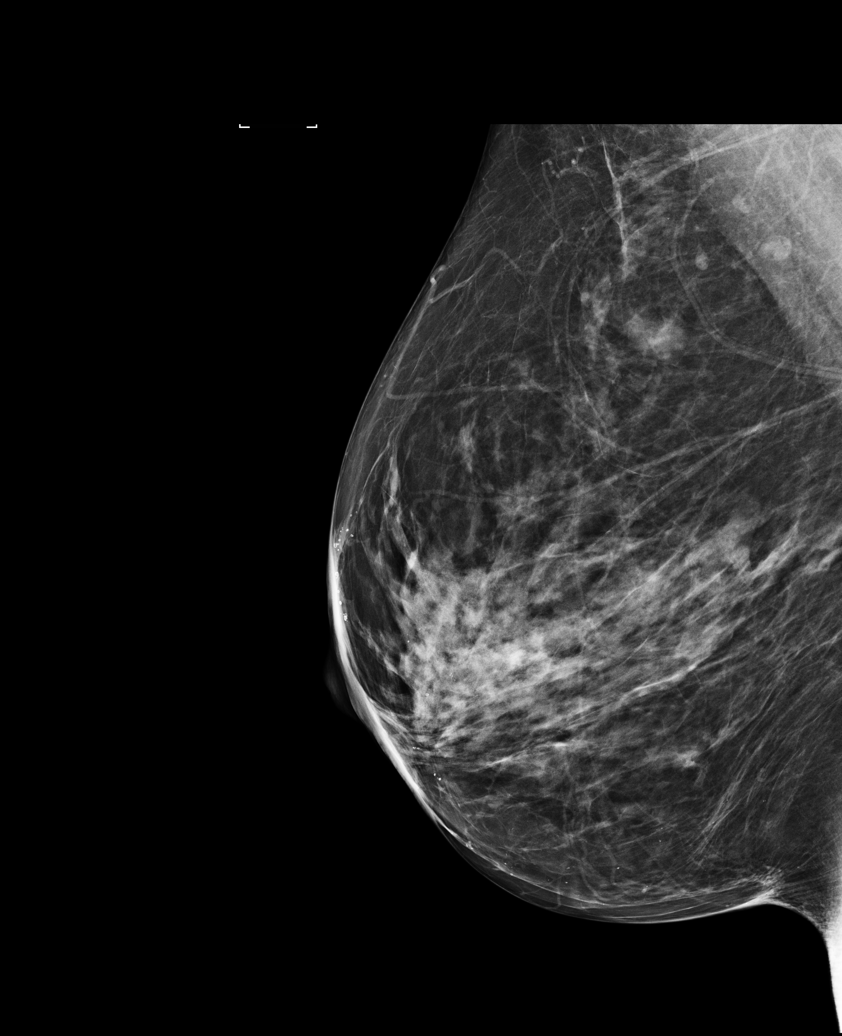

[R XCCL]
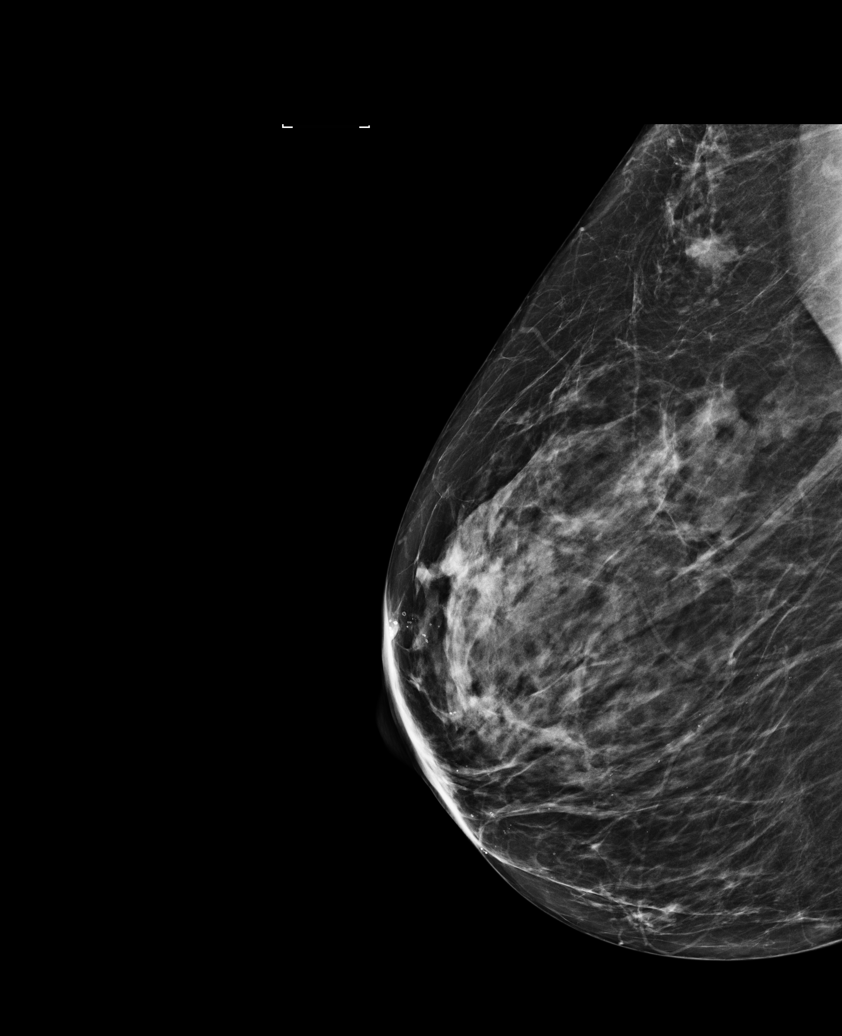

[R MLO synth-2D]
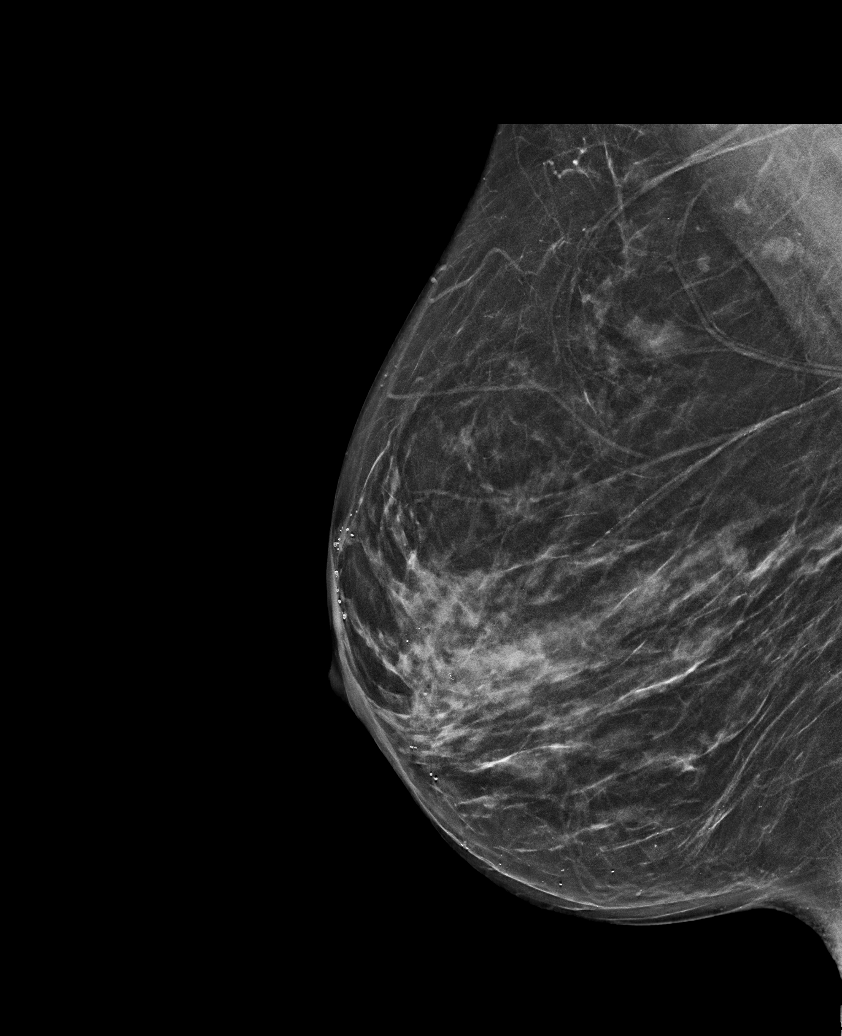

[6 of 21 positions shown; findings below may reference images not displayed]

ACR Breast Density Category c: The breast tissue is heterogeneously
dense, which may obscure small masses.
FINDINGS: Whole breast views including tomography CC and MLO projections show
postsurgical changes of breast reduction. Fibroglandular tissue in
the far upper outer right breast appears similar compared to prior
mammograms. No mass or nonsurgical distortion is identified.

Mammographic images were processed with CAD.

On physical exam, the far outer and upper right breast is soft to
palpation. No mass or thickening is felt.

Targeted ultrasound is performed, showing normal fibroglandular
tissue in the upper outer quadrant of the right breast. No
suspicious findings.
IMPRESSION: No evidence of malignancy in the right breast.

RECOMMENDATION:
Screening mammogram in one year.(Code:83-J-BWN)

I have discussed the findings and recommendations with the patient.
Results were also provided in writing at the conclusion of the
visit. If applicable, a reminder letter will be sent to the patient
regarding the next appointment.

BI-RADS CATEGORY  1: Negative.

## 2019-03-08 DIAGNOSIS — Z4822 Encounter for aftercare following kidney transplant: Secondary | ICD-10-CM | POA: Diagnosis not present

## 2019-03-08 DIAGNOSIS — Z79899 Other long term (current) drug therapy: Secondary | ICD-10-CM | POA: Diagnosis not present

## 2019-04-05 DIAGNOSIS — R945 Abnormal results of liver function studies: Secondary | ICD-10-CM | POA: Diagnosis not present

## 2019-04-05 DIAGNOSIS — D649 Anemia, unspecified: Secondary | ICD-10-CM | POA: Diagnosis not present

## 2019-04-05 DIAGNOSIS — I10 Essential (primary) hypertension: Secondary | ICD-10-CM | POA: Diagnosis not present

## 2019-04-05 DIAGNOSIS — Z79899 Other long term (current) drug therapy: Secondary | ICD-10-CM | POA: Diagnosis not present

## 2019-04-05 DIAGNOSIS — R05 Cough: Secondary | ICD-10-CM | POA: Diagnosis not present

## 2019-04-05 DIAGNOSIS — Z96649 Presence of unspecified artificial hip joint: Secondary | ICD-10-CM | POA: Diagnosis not present

## 2019-04-05 DIAGNOSIS — Z794 Long term (current) use of insulin: Secondary | ICD-10-CM | POA: Diagnosis not present

## 2019-04-05 DIAGNOSIS — Z792 Long term (current) use of antibiotics: Secondary | ICD-10-CM | POA: Diagnosis not present

## 2019-04-05 DIAGNOSIS — Z7952 Long term (current) use of systemic steroids: Secondary | ICD-10-CM | POA: Diagnosis not present

## 2019-04-05 DIAGNOSIS — E7849 Other hyperlipidemia: Secondary | ICD-10-CM | POA: Diagnosis not present

## 2019-04-05 DIAGNOSIS — D72819 Decreased white blood cell count, unspecified: Secondary | ICD-10-CM | POA: Diagnosis not present

## 2019-04-05 DIAGNOSIS — Z4822 Encounter for aftercare following kidney transplant: Secondary | ICD-10-CM | POA: Diagnosis not present

## 2019-04-05 DIAGNOSIS — E119 Type 2 diabetes mellitus without complications: Secondary | ICD-10-CM | POA: Diagnosis not present

## 2019-05-01 NOTE — Progress Notes (Signed)
Virtual Visit via Video Note  I connected with Krista Bean on 05/02/19 at 10:45 AM EDT by a video enabled telemedicine application and verified that I am speaking with the correct person using two identifiers.   I discussed the limitations of evaluation and management by telemedicine and the availability of in person appointments. The patient expressed understanding and agreed to proceed.  The patient is currently at home and I am in the office.    No referring provider.    History of Present Illness: This is an acute visit for arthritis in hands, bladder and bladder issues.   Finger joint pain:   She has thumb joint popping and pain. She wondered if it is arthritis.  Some of the other finger joints swelling up at night.  Tylenol arthritis helps a little.     Urinary incontinence:  For a while she has had urinary incontinence.  She has seen urology.  They talked about having a procedure for her bladder.  She was on medication in the past, but it did not work.  She had a cold a few weeks ago and that made her incontinence much worse.   Anal discharge:  She has history of hemorrhoids and had surgery in the past.  She still has small hemorrhoids.  She has occasional bleeding from the hemorrhoids.  She has what feels like a constant anal discharge.  She denies rectal pain.  Her stools are soft, but formed.  She was wondering if the hemorrhoids were contributing.     She denies fever, chills.   She has mild dysuria at the end of urination.    Social History   Socioeconomic History  . Marital status: Divorced    Spouse name: Not on file  . Number of children: Not on file  . Years of education: Not on file  . Highest education level: Not on file  Occupational History  . Not on file  Social Needs  . Financial resource strain: Not on file  . Food insecurity    Worry: Not on file    Inability: Not on file  . Transportation needs    Medical: Not on file    Non-medical: Not on file   Tobacco Use  . Smoking status: Never Smoker  . Smokeless tobacco: Never Used  Substance and Sexual Activity  . Alcohol use: No    Alcohol/week: 0.0 standard drinks  . Drug use: No  . Sexual activity: Not on file  Lifestyle  . Physical activity    Days per week: Not on file    Minutes per session: Not on file  . Stress: Not on file  Relationships  . Social Herbalist on phone: Not on file    Gets together: Not on file    Attends religious service: Not on file    Active member of club or organization: Not on file    Attends meetings of clubs or organizations: Not on file    Relationship status: Not on file  Other Topics Concern  . Not on file  Social History Narrative  . Not on file     Observations/Objective: Appears well in NAD   Assessment and Plan:  See Problem List for Assessment and Plan of chronic medical problems.   Follow Up Instructions:    I discussed the assessment and treatment plan with the patient. The patient was provided an opportunity to ask questions and all were answered. The patient agreed with the plan  and demonstrated an understanding of the instructions.   The patient was advised to call back or seek an in-person evaluation if the symptoms worsen or if the condition fails to improve as anticipated.    Binnie Rail, MD

## 2019-05-02 ENCOUNTER — Encounter: Payer: Self-pay | Admitting: Internal Medicine

## 2019-05-02 ENCOUNTER — Ambulatory Visit (INDEPENDENT_AMBULATORY_CARE_PROVIDER_SITE_OTHER): Payer: Medicare Other | Admitting: Internal Medicine

## 2019-05-02 DIAGNOSIS — M19049 Primary osteoarthritis, unspecified hand: Secondary | ICD-10-CM | POA: Diagnosis not present

## 2019-05-02 DIAGNOSIS — R300 Dysuria: Secondary | ICD-10-CM | POA: Diagnosis not present

## 2019-05-02 DIAGNOSIS — R32 Unspecified urinary incontinence: Secondary | ICD-10-CM | POA: Diagnosis not present

## 2019-05-02 DIAGNOSIS — R198 Other specified symptoms and signs involving the digestive system and abdomen: Secondary | ICD-10-CM | POA: Diagnosis not present

## 2019-05-02 NOTE — Assessment & Plan Note (Signed)
Will discuss with gyn at her appt If worsens can have urine checked

## 2019-05-02 NOTE — Assessment & Plan Note (Signed)
Has been on medication in the past and saw urology and they wanted to do a procedure Sees gyn in 2 weeks - will discuss with them - ? Retry medication or consider seeing urology

## 2019-05-02 NOTE — Assessment & Plan Note (Signed)
Likely related to int hemorrhoids Deferred suppositories for hemorrhoids Can consider doing pelvic PT which may help bladder and discharge

## 2019-05-02 NOTE — Assessment & Plan Note (Signed)
Likely OA Continue tylenol arthritis Can consider checking labs for autoimmune arthritis - deferred right now

## 2019-05-03 NOTE — Telephone Encounter (Signed)
Pt was seen virtually 

## 2019-05-13 DIAGNOSIS — N390 Urinary tract infection, site not specified: Secondary | ICD-10-CM | POA: Diagnosis not present

## 2019-05-13 DIAGNOSIS — Z1231 Encounter for screening mammogram for malignant neoplasm of breast: Secondary | ICD-10-CM | POA: Diagnosis not present

## 2019-05-18 ENCOUNTER — Telehealth: Payer: Self-pay | Admitting: Internal Medicine

## 2019-05-18 MED ORDER — GLUCOSE BLOOD VI STRP: ORAL_STRIP | 2 refills | Status: DC

## 2019-05-18 MED ORDER — LANCETS MISC: 2 refills | Status: DC

## 2019-05-18 NOTE — Telephone Encounter (Signed)
Medication Refill - Medication: Diabetic Testing Supplies  Pt states she has been advised by her transplant specialist to begin monitoring her glucose. 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  Please send to:  Preferred Pharmacy:  [id number] - [place], [place] [phone] (Phone) [phone] (Fax)     Agent: Please be advised that RX refills may take up to 3 business days. We ask that you follow-up with your pharmacy.

## 2019-05-18 NOTE — Telephone Encounter (Signed)
Test strips and lancets have been sent to requested pharmacy.

## 2019-05-27 ENCOUNTER — Telehealth: Payer: Self-pay

## 2019-05-27 NOTE — Telephone Encounter (Signed)
For allergies - only otc medications --  Zyrtec, flonase - there is not much else that helps.  Can try saline nasal spray.

## 2019-05-27 NOTE — Telephone Encounter (Signed)
Copied from [place] [id number]. Topic: General - Other >> May 26, 2019 12:23 PM Percell Belt A wrote: Reason for CRM: pt called in and stated she was just seen on July 20th for cold cough and allergies.  She stated that covid test was neg but she still has this cough and would like to know if there is anything that could be call in that would help other then OTC meds?   Pharmacy -[id number] - [place], [place] [phone] (Phone)

## 2019-05-27 NOTE — Telephone Encounter (Signed)
Patient advised of dr burns note/instructions, she will try that

## 2019-06-14 ENCOUNTER — Other Ambulatory Visit: Payer: Self-pay | Admitting: Internal Medicine

## 2019-06-14 DIAGNOSIS — E872 Acidosis: Secondary | ICD-10-CM | POA: Diagnosis not present

## 2019-06-14 DIAGNOSIS — R945 Abnormal results of liver function studies: Secondary | ICD-10-CM | POA: Diagnosis not present

## 2019-06-14 DIAGNOSIS — E785 Hyperlipidemia, unspecified: Secondary | ICD-10-CM | POA: Diagnosis not present

## 2019-06-14 DIAGNOSIS — Z7952 Long term (current) use of systemic steroids: Secondary | ICD-10-CM | POA: Diagnosis not present

## 2019-06-14 DIAGNOSIS — E118 Type 2 diabetes mellitus with unspecified complications: Secondary | ICD-10-CM | POA: Diagnosis not present

## 2019-06-14 DIAGNOSIS — K439 Ventral hernia without obstruction or gangrene: Secondary | ICD-10-CM | POA: Diagnosis not present

## 2019-06-14 DIAGNOSIS — Z940 Kidney transplant status: Secondary | ICD-10-CM | POA: Diagnosis not present

## 2019-06-14 DIAGNOSIS — I10 Essential (primary) hypertension: Secondary | ICD-10-CM | POA: Diagnosis not present

## 2019-06-14 DIAGNOSIS — D649 Anemia, unspecified: Secondary | ICD-10-CM | POA: Diagnosis not present

## 2019-06-14 DIAGNOSIS — D72819 Decreased white blood cell count, unspecified: Secondary | ICD-10-CM | POA: Diagnosis not present

## 2019-06-14 DIAGNOSIS — D899 Disorder involving the immune mechanism, unspecified: Secondary | ICD-10-CM | POA: Diagnosis not present

## 2019-06-14 DIAGNOSIS — Z79899 Other long term (current) drug therapy: Secondary | ICD-10-CM | POA: Diagnosis not present

## 2019-06-14 DIAGNOSIS — Z792 Long term (current) use of antibiotics: Secondary | ICD-10-CM | POA: Diagnosis not present

## 2019-06-14 DIAGNOSIS — Z4822 Encounter for aftercare following kidney transplant: Secondary | ICD-10-CM | POA: Diagnosis not present

## 2019-06-14 DIAGNOSIS — Z96641 Presence of right artificial hip joint: Secondary | ICD-10-CM | POA: Diagnosis not present

## 2019-06-21 DIAGNOSIS — T8189XA Other complications of procedures, not elsewhere classified, initial encounter: Secondary | ICD-10-CM | POA: Diagnosis not present

## 2019-06-21 DIAGNOSIS — Z79899 Other long term (current) drug therapy: Secondary | ICD-10-CM | POA: Diagnosis not present

## 2019-06-21 DIAGNOSIS — K439 Ventral hernia without obstruction or gangrene: Secondary | ICD-10-CM | POA: Diagnosis not present

## 2019-06-21 DIAGNOSIS — K668 Other specified disorders of peritoneum: Secondary | ICD-10-CM | POA: Diagnosis not present

## 2019-06-21 DIAGNOSIS — Z7952 Long term (current) use of systemic steroids: Secondary | ICD-10-CM | POA: Diagnosis not present

## 2019-06-21 DIAGNOSIS — I10 Essential (primary) hypertension: Secondary | ICD-10-CM | POA: Diagnosis not present

## 2019-06-21 DIAGNOSIS — Z940 Kidney transplant status: Secondary | ICD-10-CM | POA: Diagnosis not present

## 2019-07-04 NOTE — Progress Notes (Signed)
Subjective:    Patient ID: Krista Bean, female    DOB: Jan 08, 1966, 53 y.o.   MRN: [id number]  HPI The patient is here for an acute visit.   Left thumb pain:  She has been having left thumb pain for over one month.  She denies any injuries or repetitive activities.  It has been getting stuck and was swollen.  The swelling did get better.  She denies numbness and tingling.  She denies redness and warmth.  She has been taking ibuprofen.  She has been wearing a brace, which is very helpful.  She cannot take any more ibuprofen.  Hemorrhoids:  She has external hemorrhoids.  She has discomfort.  Discomfort has just been lingering.  She has only occasional bleeding - typically when constipated.  She does not have constipation often.  She has been using preparation H.   The discomfort is bothersome and she would like more definitive treatment.   Medications and allergies reviewed with patient and updated if appropriate.  Patient Active Problem List   Diagnosis Date Noted  . 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 Urinary incontinence 05/02/2019  . Anal discharge 05/02/2019  . Dysuria 05/02/2019  . Depression 08/24/2018  . Aortic valve regurgitation 10/24/2017  . Cardiac murmur 09/23/2017  . Shortness of breath 09/23/2017  . S/P hip replacement, right 08/04/2017  . Sleep difficulties 03/19/2016  . Type 2 diabetes mellitus ([place]) 03/05/2016  . Anxiety 06/09/2014  . Gout 09/13/2013  . End stage renal disease ([place]) 06/29/2012  . Avascular necrosis of femoral head ([place]) 09/13/2011  . Pulmonary hypertension ([place]) 09/09/2011  . KIDNEY TRANSPLANTATION, HX OF 12/08/2007  . PERIPHERAL NEUROPATHY, FEET 12/28/2006  . HIV (human immunodeficiency virus infection) ([place]) 07/21/2006    Current Outpatient Medications on File Prior to Visit  Medication Sig Dispense Refill  . abacavir (ZIAGEN) 300 MG tablet Take 300 mg by mouth 2 (two) times daily. Reported on 03/05/2016    . ALPRAZolam (XANAX) 0.25 MG tablet  Take 1 tablet (0.25 mg total) by mouth at bedtime as needed. for sleep 30 tablet 0  . aspirin EC 81 MG tablet Take 81 mg by mouth.    . calcium acetate (PHOSLO) 667 MG capsule Take 2 capsules by mouth Three times a day after meals.    . furosemide (LASIX) 20 MG tablet Reported on 10/30/2015    . glucose blood test strip Use to check blood sugars once daily. E11.9 100 each 2  . hydrALAZINE (APRESOLINE) 50 MG tablet Take 50 mg by mouth daily. Reported on 03/05/2016    . labetalol (NORMODYNE) 200 MG tablet TAKE 1 TABLET (200 MG TOTAL) BY MOUTH 2 TIMES DAILY.    Marland Kitchen lamiVUDine (EPIVIR) 150 MG tablet Take 150 mg by mouth daily.     . Lancets MISC Use to check blood sugars once daily. E11.9 100 each 2  . metoprolol (LOPRESSOR) 100 MG tablet Take 50 mg by mouth 2 (two) times daily. Reported on 03/05/2016    . mycophenolate (MYFORTIC) 360 MG TBEC EC tablet Take 360 mg by mouth 2 (two) times daily.    . phosphorus (K-PHOS-NEUTRAL) 155-852-130 MG tablet Take 500 mg by mouth 2 (two) times daily.     . predniSONE (DELTASONE) 5 MG tablet Take 1.5 mg by mouth daily.     . raltegravir (ISENTRESS) 400 MG tablet TAKE 1 TABLET (400 MG TOTAL) BY MOUTH 2 TIMES DAILY.    . sodium chloride 0.9 % SOLN with belatacept 250 MG  SOLR Inject into the vein.    Marland Kitchen sulfamethoxazole-trimethoprim (BACTRIM DS) 800-160 MG per tablet Take 1 tablet by mouth daily.    . tacrolimus (PROGRAF) 1 MG capsule Take 5 mg by mouth 2 (two) times daily.      No current facility-administered medications on file prior to visit.     Past Medical History:  Diagnosis Date  . Allergy    seasonal  . Blood transfusion without reported diagnosis   . ESRD (end stage renal disease) ([place])   . Heart murmur   . HIV infection ([place])   . Hyperglycemia   . Peripheral neuropathy    Feet  . Renal failure    Hx. of Kidney transplantation    Past Surgical History:  Procedure Laterality Date  . BREAST REDUCTION SURGERY    . HEMORROIDECTOMY     unsure of  year but thinks 48  . KIDNEY TRANSPLANT Left 05-2014  . KIDNEY TRANSPLANT Right 2010  . REDUCTION MAMMAPLASTY    . SHUNTOGRAM N/A 07/01/2012   Procedure: Earney Mallet;  Surgeon: Conrad Putnam, MD;  Location: [facility] CATH LAB;  Service: Cardiovascular;  Laterality: N/A;  . TOTAL HIP ARTHROPLASTY Right 11/2016  . Transposition AVF  Feb. 22, 2011   Right  by Dr. Rosalia Hammers  . TUBAL LIGATION  1993    Social History   Socioeconomic History  . Marital status: Divorced    Spouse name: Not on file  . Number of children: Not on file  . Years of education: Not on file  . Highest education level: Not on file  Occupational History  . Not on file  Social Needs  . Financial resource strain: Not on file  . Food insecurity    Worry: Not on file    Inability: Not on file  . Transportation needs    Medical: Not on file    Non-medical: Not on file  Tobacco Use  . Smoking status: Never Smoker  . Smokeless tobacco: Never Used  Substance and Sexual Activity  . Alcohol use: No    Alcohol/week: 0.0 standard drinks  . Drug use: No  . Sexual activity: Not on file  Lifestyle  . Physical activity    Days per week: Not on file    Minutes per session: Not on file  . Stress: Not on file  Relationships  . Social Herbalist on phone: Not on file    Gets together: Not on file    Attends religious service: Not on file    Active member of club or organization: Not on file    Attends meetings of clubs or organizations: Not on file    Relationship status: Not on file  Other Topics Concern  . Not on file  Social History Narrative  . Not on file    Family History  Problem Relation Age of Onset  . Diabetes Father   . Colon cancer Neg Hx   . Colon polyps Neg Hx   . Esophageal cancer Neg Hx   . Rectal cancer Neg Hx   . Stomach cancer Neg Hx     Review of Systems Per HPI    Objective:   Vitals:   07/05/19 0743  BP: 136/68  Pulse: 69  Resp: 16  Temp: 98.2 F (36.8 C)  SpO2: 98%    BP Readings from Last 3 Encounters:  07/05/19 136/68  08/24/18 128/64  10/23/17 130/75   Wt Readings from Last 3 Encounters:  07/05/19 189  lb 12.8 oz (86.1 kg)  08/24/18 187 lb (84.8 kg)  10/28/17 173 lb (78.5 kg)   Body mass index is 30.63 kg/m.   Physical Exam Constitutional:      General: She is not in acute distress.    Appearance: Normal appearance. She is not ill-appearing.  HENT:     Head: Normocephalic and atraumatic.  Genitourinary:    Comments: Deferred GU exam Musculoskeletal:     Comments: Left thumb tenderness at PIP joint and MCP joint, no significant swelling, no erythema or warmth, DIP joint locks and is painful to extend  Skin:    General: Skin is warm and dry.     Findings: No erythema or rash.  Neurological:     Sensory: No sensory deficit.     Motor: No weakness.            Assessment & Plan:    See Problem List for Assessment and Plan of chronic medical problems.

## 2019-07-05 ENCOUNTER — Encounter: Payer: Self-pay | Admitting: Internal Medicine

## 2019-07-05 ENCOUNTER — Ambulatory Visit (INDEPENDENT_AMBULATORY_CARE_PROVIDER_SITE_OTHER): Payer: Medicare Other | Admitting: Internal Medicine

## 2019-07-05 ENCOUNTER — Other Ambulatory Visit: Payer: Self-pay

## 2019-07-05 VITALS — BP 136/68 | HR 69 | Temp 98.2°F | Resp 16 | Ht 66.0 in | Wt 189.8 lb

## 2019-07-05 DIAGNOSIS — K644 Residual hemorrhoidal skin tags: Secondary | ICD-10-CM

## 2019-07-05 DIAGNOSIS — R7303 Prediabetes: Secondary | ICD-10-CM

## 2019-07-05 DIAGNOSIS — M1812 Unilateral primary osteoarthritis of first carpometacarpal joint, left hand: Secondary | ICD-10-CM | POA: Diagnosis not present

## 2019-07-05 MED ORDER — ATORVASTATIN CALCIUM 10 MG PO TABS: 10.0000 mg | ORAL_TABLET | Freq: Every day | ORAL | Status: DC

## 2019-07-05 NOTE — Assessment & Plan Note (Signed)
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

## 2019-07-05 NOTE — Assessment & Plan Note (Signed)
Arthritis of left thumb Discussed topical treatments, Tylenol, using brace Will refer to hand orthopedics-May benefit from injection

## 2019-07-05 NOTE — Assessment & Plan Note (Signed)
Experiencing persistent discomfort from external hemorrhoids Has occasional constipation, but nothing regular Over-the-counter treatments not effective She is interested in more permanent treatment Will refer to GI for possible banding

## 2019-07-05 NOTE — Patient Instructions (Addendum)
  Medications reviewed and updated.  Changes include :  none    A referral was ordered for GI and hand orthopedics

## 2019-07-12 DIAGNOSIS — Z7952 Long term (current) use of systemic steroids: Secondary | ICD-10-CM | POA: Diagnosis not present

## 2019-07-12 DIAGNOSIS — Z792 Long term (current) use of antibiotics: Secondary | ICD-10-CM | POA: Diagnosis not present

## 2019-07-12 DIAGNOSIS — Z79899 Other long term (current) drug therapy: Secondary | ICD-10-CM | POA: Diagnosis not present

## 2019-07-12 DIAGNOSIS — Z23 Encounter for immunization: Secondary | ICD-10-CM | POA: Diagnosis not present

## 2019-07-12 DIAGNOSIS — Z5181 Encounter for therapeutic drug level monitoring: Secondary | ICD-10-CM | POA: Diagnosis not present

## 2019-07-12 DIAGNOSIS — Z4822 Encounter for aftercare following kidney transplant: Secondary | ICD-10-CM | POA: Diagnosis not present

## 2019-07-15 ENCOUNTER — Ambulatory Visit: Payer: Self-pay

## 2019-07-15 ENCOUNTER — Encounter: Payer: Self-pay | Admitting: Orthopaedic Surgery

## 2019-07-15 ENCOUNTER — Ambulatory Visit (INDEPENDENT_AMBULATORY_CARE_PROVIDER_SITE_OTHER): Payer: Medicare Other | Admitting: Orthopaedic Surgery

## 2019-07-15 DIAGNOSIS — M65312 Trigger thumb, left thumb: Secondary | ICD-10-CM

## 2019-07-15 MED ORDER — BUPIVACAINE HCL 0.5 % IJ SOLN
0.3300 mL | INTRAMUSCULAR | Status: AC | PRN
  Administered 2019-07-15: .33 mL

## 2019-07-15 MED ORDER — METHYLPREDNISOLONE ACETATE 40 MG/ML IJ SUSP
13.3300 mg | INTRAMUSCULAR | Status: AC | PRN
  Administered 2019-07-15: 09:00:00 13.33 mg

## 2019-07-15 MED ORDER — LIDOCAINE HCL 1 % IJ SOLN
0.3000 mL | INTRAMUSCULAR | Status: AC | PRN
  Administered 2019-07-15: 09:00:00 .3 mL

## 2019-07-15 NOTE — Progress Notes (Signed)
Office Visit Note   Patient: Krista Bean           Date of Birth: 07-24-1966           MRN: [id number] Visit Date: 07/15/2019              Requested by: Binnie Rail, MD [place],  [place] [address] PCP: Binnie Rail, MD   Assessment & Plan: Visit Diagnoses:  1. Trigger thumb of left hand     Plan: Impression is symptomatic left trigger thumb.  Trigger finger injection was performed today into the flexor tendon sheath under sterile conditions.  Patient tolerated well.  She will take it easy over the next month as it gets better.  Questions encouraged and answered.  Follow-up as needed.  Patient is status post kidney transplant.  Follow-Up Instructions: Return if symptoms worsen or fail to improve.   Orders:  Orders Placed This Encounter  Procedures  . Hand/UE Inj: L thumb A1  . XR Finger Thumb Left   No orders of the defined types were placed in this encounter.     Procedures: Hand/UE Inj: L thumb A1 for trigger finger on 07/15/2019 8:52 AM Indications: pain Details: 25 G needle Medications: 0.3 mL lidocaine 1 %; 0.33 mL bupivacaine 0.5 %; 13.33 mg methylPREDNISolone acetate 40 MG/ML Outcome: tolerated well, no immediate complications Consent was given by the patient. Patient was prepped and draped in the usual sterile fashion.       Clinical Data: No additional findings.   Subjective: Chief Complaint  Patient presents with  . Left Thumb - Pain, Numbness, Tingling    Krista Bean is a 53 year old female comes in for evaluation of a painful left thumb.  She denies any injuries.  She endorses pain near the MP joint.  She states that it locks up and triggers on her.  Denies any true numbness or tingling.  She does not do anything repetitive with her hands.  She is right-hand dominant.  She has tried a couple of doses of ibuprofen without relief.   Review of Systems  Constitutional: Negative.   HENT: Negative.   Eyes: Negative.   Respiratory: Negative.    Cardiovascular: Negative.   Endocrine: Negative.   Musculoskeletal: Negative.   Neurological: Negative.   Hematological: Negative.   Psychiatric/Behavioral: Negative.   All other systems reviewed and are negative.    Objective: Vital Signs: There were no vitals taken for this visit.  Physical Exam Vitals signs and nursing note reviewed.  Constitutional:      Appearance: She is well-developed.  HENT:     Head: Normocephalic and atraumatic.  Neck:     Musculoskeletal: Neck supple.  Pulmonary:     Effort: Pulmonary effort is normal.  Abdominal:     Palpations: Abdomen is soft.  Skin:    General: Skin is warm.     Capillary Refill: Capillary refill takes less than 2 seconds.  Neurological:     Mental Status: She is alert and oriented to person, place, and time.  Psychiatric:        Behavior: Behavior normal.        Thought Content: Thought content normal.        Judgment: Judgment normal.     Ortho Exam Left thumb exam shows painful popping and triggering of the flexor tendon at the A1 pulley.  This is tender to palpation.  Negative grind test.  Negative Finkelstein's.  No tenderness palpation elsewhere. Specialty  Comments:  No specialty comments available.  Imaging: Xr Finger Thumb Left  Result Date: 07/15/2019 No acute or structural abnormalities    PMFS History: Patient Active Problem List   Diagnosis Date Noted  . External hemorrhoids 07/05/2019  . Degenerative arthritis of thumb, left 07/05/2019  . 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 Urinary incontinence 05/02/2019  . Anal discharge 05/02/2019  . Dysuria 05/02/2019  . Depression 08/24/2018  . Aortic valve regurgitation 10/24/2017  . Cardiac murmur 09/23/2017  . Shortness of breath 09/23/2017  . S/P hip replacement, right 08/04/2017  . Sleep difficulties 03/19/2016  . Prediabetes 03/05/2016  . Anxiety 06/09/2014  . Gout 09/13/2013  . End stage renal disease ([place]) 06/29/2012  . Avascular necrosis of  femoral head ([place]) 09/13/2011  . Pulmonary hypertension ([place]) 09/09/2011  . KIDNEY TRANSPLANTATION, HX OF 12/08/2007  . PERIPHERAL NEUROPATHY, FEET 12/28/2006  . HIV (human immunodeficiency virus infection) ([place]) 07/21/2006   Past Medical History:  Diagnosis Date  . Allergy    seasonal  . Blood transfusion without reported diagnosis   . ESRD (end stage renal disease) ([place])   . Heart murmur   . HIV infection ([place])   . Hyperglycemia   . Peripheral neuropathy    Feet  . Renal failure    Hx. of Kidney transplantation    Family History  Problem Relation Age of Onset  . Diabetes Father   . Colon cancer Neg Hx   . Colon polyps Neg Hx   . Esophageal cancer Neg Hx   . Rectal cancer Neg Hx   . Stomach cancer Neg Hx     Past Surgical History:  Procedure Laterality Date  . BREAST REDUCTION SURGERY    . HEMORROIDECTOMY     unsure of year but thinks 38  . KIDNEY TRANSPLANT Left 05-2014  . KIDNEY TRANSPLANT Right 2010  . REDUCTION MAMMAPLASTY    . SHUNTOGRAM N/A 07/01/2012   Procedure: Earney Mallet;  Surgeon: Conrad Ellsinore, MD;  Location: [facility] CATH LAB;  Service: Cardiovascular;  Laterality: N/A;  . TOTAL HIP ARTHROPLASTY Right 11/2016  . Transposition AVF  Feb. 22, 2011   Right  by Dr. Rosalia Hammers  . TUBAL LIGATION  1993   Social History   Occupational History  . Not on file  Tobacco Use  . Smoking status: Never Smoker  . Smokeless tobacco: Never Used  Substance and Sexual Activity  . Alcohol use: No    Alcohol/week: 0.0 standard drinks  . Drug use: No  . Sexual activity: Not on file

## 2019-07-18 ENCOUNTER — Encounter: Payer: Self-pay | Admitting: Internal Medicine

## 2019-07-25 ENCOUNTER — Encounter: Payer: Self-pay | Admitting: Physician Assistant

## 2019-07-25 ENCOUNTER — Ambulatory Visit (INDEPENDENT_AMBULATORY_CARE_PROVIDER_SITE_OTHER): Payer: Medicare Other | Admitting: Physician Assistant

## 2019-07-25 VITALS — BP 124/62 | HR 84 | Temp 98.0°F | Ht 66.0 in | Wt 186.2 lb

## 2019-07-25 DIAGNOSIS — K625 Hemorrhage of anus and rectum: Secondary | ICD-10-CM | POA: Diagnosis not present

## 2019-07-25 DIAGNOSIS — K648 Other hemorrhoids: Secondary | ICD-10-CM

## 2019-07-25 MED ORDER — HYDROCORTISONE ACETATE 25 MG RE SUPP: 25.0000 mg | Freq: Every day | RECTAL | 6 refills | Status: DC

## 2019-07-25 NOTE — Progress Notes (Signed)
Subjective:    Patient ID: Krista Bean, female    DOB: [birth year], 53 y.o.   MRN: [id number]  HPI Krista Bean is a pleasant 53 year old African-American female, known to Krista Bean from prior colonoscopy who comes in today with complaints of chronic hemorrhoidal symptoms. Patient has history of end-stage renal disease is status post kidney transplant, she is status post right hip replacement has history of HIV, and pulmonary hypertension. She reports very remote hemorrhoidectomy done around 1999. She had colonoscopy in January 2017 with finding of grade 1 internal hemorrhoids and hypertrophied anal papillae there was one 4 mm polyp removed which was hyperplastic. He says she has been having ongoing problems with intermittent anal rectal discomfort and itching.  Occasionally she gets a sharp internal rectal pain.  She is not having any chronic problems with constipation or straining.  She is not having any significant discomfort with defecation.  She has noticed some intermittent small-volume bright red blood usually on the tissue and very occasionally in the commode.  She finds the symptoms aggravating.  She has tried Preparation H without much benefit.  No current complaints of abdominal discomfort.  Review of Systems Pertinent positive and negative review of systems were noted in the above HPI section.  All other review of systems was otherwise negative.  Outpatient Encounter Medications as of 07/25/2019  Medication Sig  . abacavir (ZIAGEN) 300 MG tablet Take 300 mg by mouth 2 (two) times daily. Reported on 03/05/2016  . aspirin EC 81 MG tablet Take 81 mg by mouth.  Marland Kitchen atorvastatin (LIPITOR) 10 MG tablet Take 1 tablet (10 mg total) by mouth daily.  . calcium acetate (PHOSLO) 667 MG capsule Take 2 capsules by mouth Three times a day after meals.  . furosemide (LASIX) 20 MG tablet Reported on 10/30/2015  . hydrALAZINE (APRESOLINE) 50 MG tablet Take 50 mg by mouth daily. Reported on 03/05/2016  .  labetalol (NORMODYNE) 200 MG tablet TAKE 1 TABLET (200 MG TOTAL) BY MOUTH 2 TIMES DAILY.  Marland Kitchen lamiVUDine (EPIVIR) 150 MG tablet Take 150 mg by mouth daily.   . Lancets MISC Use to check blood sugars once daily. E11.9  . metoprolol (LOPRESSOR) 100 MG tablet Take 50 mg by mouth 2 (two) times daily. Reported on 03/05/2016  . mycophenolate (MYFORTIC) 360 MG TBEC EC tablet Take 360 mg by mouth 2 (two) times daily.  . phosphorus (K-PHOS-NEUTRAL) 155-852-130 MG tablet Take 500 mg by mouth 2 (two) times daily.   . predniSONE (DELTASONE) 5 MG tablet Take 1.5 mg by mouth daily.   . raltegravir (ISENTRESS) 400 MG tablet TAKE 1 TABLET (400 MG TOTAL) BY MOUTH 2 TIMES DAILY.  . sodium chloride 0.9 % SOLN with belatacept 250 MG SOLR Inject into the vein.  Marland Kitchen sulfamethoxazole-trimethoprim (BACTRIM DS) 800-160 MG per tablet Take 1 tablet by mouth daily.  . tacrolimus (PROGRAF) 1 MG capsule Take 5 mg by mouth 2 (two) times daily.   . hydrocortisone (ANUSOL-HC) 25 MG suppository Place 1 suppository (25 mg total) rectally at bedtime. Use for 7 days and then as needed  . [DISCONTINUED] glucose blood test strip Use to check blood sugars once daily. E11.9   No facility-administered encounter medications on file as of 07/25/2019.    Allergies  Allergen Reactions  . Ciprofloxacin Hives  . Vicodin [Hydrocodone-Acetaminophen] Other (See Comments)    Delusions    Patient Active Problem List   Diagnosis Date Noted  . External hemorrhoids 07/05/2019  . Degenerative arthritis of thumb,  left 07/05/2019  . 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 Urinary incontinence 05/02/2019  . Anal discharge 05/02/2019  . Dysuria 05/02/2019  . Depression 08/24/2018  . Aortic valve regurgitation 10/24/2017  . Cardiac murmur 09/23/2017  . Shortness of breath 09/23/2017  . S/P hip replacement, right 08/04/2017  . Sleep difficulties 03/19/2016  . Prediabetes 03/05/2016  . Anxiety 06/09/2014  . Gout 09/13/2013  . End stage renal disease ([place])  06/29/2012  . Avascular necrosis of femoral head ([place]) 09/13/2011  . Pulmonary hypertension ([place]) 09/09/2011  . KIDNEY TRANSPLANTATION, HX OF 12/08/2007  . PERIPHERAL NEUROPATHY, FEET 12/28/2006  . HIV (human immunodeficiency virus infection) ([place]) 07/21/2006   Social History   Socioeconomic History  . Marital status: Divorced    Spouse name: Not on file  . Number of children: Not on file  . Years of education: Not on file  . Highest education level: Not on file  Occupational History  . Not on file  Social Needs  . Financial resource strain: Not on file  . Food insecurity    Worry: Not on file    Inability: Not on file  . Transportation needs    Medical: Not on file    Non-medical: Not on file  Tobacco Use  . Smoking status: Never Smoker  . Smokeless tobacco: Never Used  Substance and Sexual Activity  . Alcohol use: No    Alcohol/week: 0.0 standard drinks  . Drug use: No  . Sexual activity: Not on file  Lifestyle  . Physical activity    Days per week: Not on file    Minutes per session: Not on file  . Stress: Not on file  Relationships  . Social Herbalist on phone: Not on file    Gets together: Not on file    Attends religious service: Not on file    Active member of club or organization: Not on file    Attends meetings of clubs or organizations: Not on file    Relationship status: Not on file  . Intimate partner violence    Fear of current or ex partner: Not on file    Emotionally abused: Not on file    Physically abused: Not on file    Forced sexual activity: Not on file  Other Topics Concern  . Not on file  Social History Narrative  . Not on file    Krista Bean family history includes Diabetes in her father.      Objective:    Vitals:   07/25/19 1035  BP: 124/62  Pulse: 84  Temp: 98 F (36.7 C)    Physical Exam Well-developed well-nourished African-American female in no acute distress.  Height, Weight, 186 BMI 30.06  HEENT;  nontraumatic normocephalic, EOMI, PER R LA, sclera anicteric. Oropharynx; not examined/COVID/mass Neck; supple, no JVD Cardiovascular; regular rate and rhythm with S1-S2, no murmur rub or gallop Pulmonary; Clear bilaterally Abdomen; soft, nontender, nondistended, no palpable mass or hepatosplenomegaly, bowel sounds are active Rectal; small external hemorrhoidal tag, on anoscopy small internal hemorrhoids and 1 visible hypertrophied anal papillae Skin; benign exam, no jaundice rash or appreciable lesions Extremities; no clubbing cyanosis or edema skin warm and dry Neuro/Psych; alert and oriented x4, grossly nonfocal mood and affect appropriate       Assessment & Bean:   #61 53 year old African-American female with chronic intermittent anal rectal discomfort, some pruritus and intermittent hematochezia. Symptoms likely secondary to internal hemorrhoids.  She does not have any significant external hemorrhoids. #  2.  Hypertrophied anal papillae #3.  Hyperplastic colon polyp-up-to-date with colonoscopy last done January 2017 #4.  End-stage renal disease now status post kidney transplant next line  #5 HIV #6.  Pulmonary hypertension #7.  Status post remote hemorrhoidectomy.  Bean start Anusol HC suppositories 1 per rectum nightly x7 days then repeat course as needed for symptoms. We discussed in office hemorrhoidal banding as an option for more definitive treatment for symptomatic internal hemorrhoids.  She would like to proceed with this and will be scheduled for banding appointment with Krista Bean.  Procedure was discussed in detail with the patient and she was provided with educational materials.  Amy Genia Harold PA-C 07/25/2019   Cc: Binnie Rail, MD

## 2019-07-25 NOTE — Patient Instructions (Signed)
We have sent the following medications to your pharmacy for you to pick up at your convenience: Anusol [facility] suppositories   We are giving you Hemorrhoid Banding information today to read over.  Call us back if you want to set up an appointment with Dr Fuller Plan for this.    I appreciate the opportunity to care for you. Amy Esterwood, PA-C

## 2019-07-26 NOTE — Progress Notes (Signed)
Reviewed and agree with management plan.  Indy Kuck T. Croy Drumwright, MD FACG [facility]  

## 2019-08-03 ENCOUNTER — Telehealth: Payer: Self-pay | Admitting: Gastroenterology

## 2019-08-03 NOTE — Telephone Encounter (Signed)
Hem banding #1 scheduled with the patient for 11/2 3:40

## 2019-08-09 DIAGNOSIS — D649 Anemia, unspecified: Secondary | ICD-10-CM | POA: Diagnosis not present

## 2019-08-09 DIAGNOSIS — Z4822 Encounter for aftercare following kidney transplant: Secondary | ICD-10-CM | POA: Diagnosis not present

## 2019-08-09 DIAGNOSIS — Z79899 Other long term (current) drug therapy: Secondary | ICD-10-CM | POA: Diagnosis not present

## 2019-08-09 DIAGNOSIS — R300 Dysuria: Secondary | ICD-10-CM | POA: Diagnosis not present

## 2019-08-09 DIAGNOSIS — Z794 Long term (current) use of insulin: Secondary | ICD-10-CM | POA: Diagnosis not present

## 2019-08-09 DIAGNOSIS — D702 Other drug-induced agranulocytosis: Secondary | ICD-10-CM | POA: Diagnosis not present

## 2019-08-09 DIAGNOSIS — R7989 Other specified abnormal findings of blood chemistry: Secondary | ICD-10-CM | POA: Diagnosis not present

## 2019-08-09 DIAGNOSIS — E872 Acidosis: Secondary | ICD-10-CM | POA: Diagnosis not present

## 2019-08-09 DIAGNOSIS — E119 Type 2 diabetes mellitus without complications: Secondary | ICD-10-CM | POA: Diagnosis not present

## 2019-08-09 DIAGNOSIS — I10 Essential (primary) hypertension: Secondary | ICD-10-CM | POA: Diagnosis not present

## 2019-08-09 DIAGNOSIS — Z940 Kidney transplant status: Secondary | ICD-10-CM | POA: Diagnosis not present

## 2019-08-09 DIAGNOSIS — E785 Hyperlipidemia, unspecified: Secondary | ICD-10-CM | POA: Diagnosis not present

## 2019-08-09 DIAGNOSIS — D72819 Decreased white blood cell count, unspecified: Secondary | ICD-10-CM | POA: Diagnosis not present

## 2019-08-09 DIAGNOSIS — D849 Immunodeficiency, unspecified: Secondary | ICD-10-CM | POA: Diagnosis not present

## 2019-08-09 DIAGNOSIS — G4700 Insomnia, unspecified: Secondary | ICD-10-CM | POA: Diagnosis not present

## 2019-08-15 ENCOUNTER — Encounter: Payer: Self-pay | Admitting: Gastroenterology

## 2019-08-15 ENCOUNTER — Ambulatory Visit (INDEPENDENT_AMBULATORY_CARE_PROVIDER_SITE_OTHER): Payer: Medicare Other | Admitting: Gastroenterology

## 2019-08-15 ENCOUNTER — Other Ambulatory Visit: Payer: Self-pay

## 2019-08-15 VITALS — BP 142/68 | HR 80 | Temp 97.4°F | Ht 66.0 in | Wt 182.8 lb

## 2019-08-15 DIAGNOSIS — K648 Other hemorrhoids: Secondary | ICD-10-CM

## 2019-08-15 DIAGNOSIS — K641 Second degree hemorrhoids: Secondary | ICD-10-CM | POA: Diagnosis not present

## 2019-08-15 NOTE — Patient Instructions (Signed)
HEMORRHOID BANDING PROCEDURE    FOLLOW-UP CARE   1. The procedure you have had should have been relatively painless since the banding of the area involved does not have nerve endings and there is no pain sensation.  The rubber band cuts off the blood supply to the hemorrhoid and the band may fall off as soon as 48 hours after the banding (the band may occasionally be seen in the toilet bowl following a bowel movement). You may notice a temporary feeling of fullness in the rectum which should respond adequately to plain Tylenol or Motrin.  2. Following the banding, avoid strenuous exercise that evening and resume full activity the next day.  A sitz bath (soaking in a warm tub) or bidet is soothing, and can be useful for cleansing the area after bowel movements.     3. To avoid constipation, take two tablespoons of natural wheat bran, natural oat bran, flax, Benefiber or any over the counter fiber supplement and increase your water intake to 7-8 glasses daily.    4. Unless you have been prescribed anorectal medication, do not put anything inside your rectum for two weeks: No suppositories, enemas, fingers, etc.  5. Occasionally, you may have more bleeding than usual after the banding procedure.  This is often from the untreated hemorrhoids rather than the treated one.  Don't be concerned if there is a tablespoon or so of blood.  If there is more blood than this, lie flat with your bottom higher than your head and apply an ice pack to the area. If the bleeding does not stop within a half an hour or if you feel faint, call our office at [phone] or go to the emergency room.  6. Problems are not common; however, if there is a substantial amount of bleeding, severe pain, chills, fever or difficulty passing urine (very rare) or other problems, you should call us at [phone] or report to the nearest emergency room.  7. Do not stay seated continuously for more than 2-3 hours for a day or two  after the procedure.  Tighten your buttock muscles 10-15 times every two hours and take 10-15 deep breaths every 1-2 hours.  Do not spend more than a few minutes on the toilet if you cannot empty your bowel; instead re-visit the toilet at a later time.    Thank you for choosing me and [facility].  Malcolm T. Stark, Jr., MD., FACG  

## 2019-08-15 NOTE — Progress Notes (Signed)
PROCEDURE NOTE: The patient presents with symptomatic grade 2 bleeding hemorrhoids, requesting rubber band ligation of their hemorrhoidal disease.  All risks, benefits and alternative forms of therapy were described and informed consent was obtained. She has typical symptoms of proctalgia fugax and understands hemorrhoid treatments will not help this problem.   The anorectum was pre-medicated during digital exam with 0.125% NTG and 5% lidocaine with small external tags noted, no tenderness. In the Left Lateral Decubitus position anoscopic examination revealed grade 2 hemorrhoids in the RA > RP > LL positions.   The decision was made to band the RA internal hemorrhoid, and the [place] was used to perform band ligation without complication.  Digital anorectal examination was then performed to assure proper positioning of the band and to adjust the banded tissue as required. No adjustment was needed.  No complications were encountered and the patient tolerated the procedure well.  Dietary and behavioral recommendations were given and along with follow-up instructions.   Return for possible additional hemorrhoid banding in 2 - 3 weeks as required.  High fiber diet, Benefiber daily and at least 8 glasses of water daily. The patient was discharged home without pain or other post procedure problems.

## 2019-09-06 DIAGNOSIS — Z4822 Encounter for aftercare following kidney transplant: Secondary | ICD-10-CM | POA: Diagnosis not present

## 2019-09-14 ENCOUNTER — Encounter: Payer: Medicare Other | Admitting: Gastroenterology

## 2019-10-04 DIAGNOSIS — Z4822 Encounter for aftercare following kidney transplant: Secondary | ICD-10-CM | POA: Diagnosis not present

## 2019-10-05 DIAGNOSIS — Z79899 Other long term (current) drug therapy: Secondary | ICD-10-CM | POA: Diagnosis not present

## 2019-10-05 DIAGNOSIS — Z4822 Encounter for aftercare following kidney transplant: Secondary | ICD-10-CM | POA: Diagnosis not present

## 2019-10-25 DIAGNOSIS — R87612 Low grade squamous intraepithelial lesion on cytologic smear of cervix (LGSIL): Secondary | ICD-10-CM | POA: Diagnosis not present

## 2019-10-25 DIAGNOSIS — N879 Dysplasia of cervix uteri, unspecified: Secondary | ICD-10-CM | POA: Diagnosis not present

## 2019-10-27 DIAGNOSIS — Z940 Kidney transplant status: Secondary | ICD-10-CM | POA: Diagnosis not present

## 2019-10-27 DIAGNOSIS — D849 Immunodeficiency, unspecified: Secondary | ICD-10-CM | POA: Diagnosis not present

## 2019-10-27 DIAGNOSIS — D72818 Other decreased white blood cell count: Secondary | ICD-10-CM | POA: Diagnosis not present

## 2019-11-01 DIAGNOSIS — I10 Essential (primary) hypertension: Secondary | ICD-10-CM | POA: Diagnosis not present

## 2019-11-01 DIAGNOSIS — Z7952 Long term (current) use of systemic steroids: Secondary | ICD-10-CM | POA: Diagnosis not present

## 2019-11-01 DIAGNOSIS — Z79899 Other long term (current) drug therapy: Secondary | ICD-10-CM | POA: Diagnosis not present

## 2019-11-01 DIAGNOSIS — E785 Hyperlipidemia, unspecified: Secondary | ICD-10-CM | POA: Diagnosis not present

## 2019-11-01 DIAGNOSIS — Z4822 Encounter for aftercare following kidney transplant: Secondary | ICD-10-CM | POA: Diagnosis not present

## 2019-11-02 DIAGNOSIS — Z4822 Encounter for aftercare following kidney transplant: Secondary | ICD-10-CM | POA: Diagnosis not present

## 2019-11-17 DIAGNOSIS — N879 Dysplasia of cervix uteri, unspecified: Secondary | ICD-10-CM | POA: Diagnosis not present

## 2019-11-17 DIAGNOSIS — R87612 Low grade squamous intraepithelial lesion on cytologic smear of cervix (LGSIL): Secondary | ICD-10-CM | POA: Diagnosis not present

## 2019-11-17 DIAGNOSIS — N888 Other specified noninflammatory disorders of cervix uteri: Secondary | ICD-10-CM | POA: Diagnosis not present

## 2019-12-06 DIAGNOSIS — Z4822 Encounter for aftercare following kidney transplant: Secondary | ICD-10-CM | POA: Diagnosis not present

## 2019-12-06 DIAGNOSIS — Z79899 Other long term (current) drug therapy: Secondary | ICD-10-CM | POA: Diagnosis not present

## 2020-01-03 DIAGNOSIS — R1902 Left upper quadrant abdominal swelling, mass and lump: Secondary | ICD-10-CM | POA: Diagnosis not present

## 2020-01-03 DIAGNOSIS — R1907 Generalized intra-abdominal and pelvic swelling, mass and lump: Secondary | ICD-10-CM | POA: Diagnosis not present

## 2020-01-03 DIAGNOSIS — R1901 Right upper quadrant abdominal swelling, mass and lump: Secondary | ICD-10-CM | POA: Diagnosis not present

## 2020-01-03 DIAGNOSIS — K668 Other specified disorders of peritoneum: Secondary | ICD-10-CM | POA: Diagnosis not present

## 2020-01-03 DIAGNOSIS — Z940 Kidney transplant status: Secondary | ICD-10-CM | POA: Diagnosis not present

## 2020-02-02 DIAGNOSIS — E118 Type 2 diabetes mellitus with unspecified complications: Secondary | ICD-10-CM | POA: Diagnosis not present

## 2020-02-02 DIAGNOSIS — N390 Urinary tract infection, site not specified: Secondary | ICD-10-CM | POA: Diagnosis not present

## 2020-02-02 DIAGNOSIS — D849 Immunodeficiency, unspecified: Secondary | ICD-10-CM | POA: Diagnosis not present

## 2020-02-02 DIAGNOSIS — E119 Type 2 diabetes mellitus without complications: Secondary | ICD-10-CM | POA: Diagnosis not present

## 2020-02-02 DIAGNOSIS — Z4822 Encounter for aftercare following kidney transplant: Secondary | ICD-10-CM | POA: Diagnosis not present

## 2020-02-02 DIAGNOSIS — E872 Acidosis: Secondary | ICD-10-CM | POA: Diagnosis not present

## 2020-02-02 DIAGNOSIS — E785 Hyperlipidemia, unspecified: Secondary | ICD-10-CM | POA: Diagnosis not present

## 2020-02-02 DIAGNOSIS — R69 Illness, unspecified: Secondary | ICD-10-CM | POA: Diagnosis not present

## 2020-02-02 DIAGNOSIS — I10 Essential (primary) hypertension: Secondary | ICD-10-CM | POA: Diagnosis not present

## 2020-02-02 DIAGNOSIS — Z79899 Other long term (current) drug therapy: Secondary | ICD-10-CM | POA: Diagnosis not present

## 2020-02-02 DIAGNOSIS — Z940 Kidney transplant status: Secondary | ICD-10-CM | POA: Diagnosis not present

## 2020-02-02 DIAGNOSIS — R7989 Other specified abnormal findings of blood chemistry: Secondary | ICD-10-CM | POA: Diagnosis not present

## 2020-02-02 DIAGNOSIS — G4700 Insomnia, unspecified: Secondary | ICD-10-CM | POA: Diagnosis not present

## 2020-02-02 DIAGNOSIS — M87859 Other osteonecrosis, unspecified femur: Secondary | ICD-10-CM | POA: Diagnosis not present

## 2020-03-01 DIAGNOSIS — R309 Painful micturition, unspecified: Secondary | ICD-10-CM | POA: Diagnosis not present

## 2020-03-01 DIAGNOSIS — Z4822 Encounter for aftercare following kidney transplant: Secondary | ICD-10-CM | POA: Diagnosis not present

## 2020-03-01 DIAGNOSIS — Z79899 Other long term (current) drug therapy: Secondary | ICD-10-CM | POA: Diagnosis not present

## 2020-03-08 DIAGNOSIS — N760 Acute vaginitis: Secondary | ICD-10-CM | POA: Diagnosis not present

## 2020-03-08 DIAGNOSIS — A630 Anogenital (venereal) warts: Secondary | ICD-10-CM | POA: Diagnosis not present

## 2020-03-08 DIAGNOSIS — R69 Illness, unspecified: Secondary | ICD-10-CM | POA: Diagnosis not present

## 2020-03-08 DIAGNOSIS — N898 Other specified noninflammatory disorders of vagina: Secondary | ICD-10-CM | POA: Diagnosis not present

## 2020-03-09 DIAGNOSIS — N2581 Secondary hyperparathyroidism of renal origin: Secondary | ICD-10-CM | POA: Diagnosis not present

## 2020-03-09 DIAGNOSIS — D631 Anemia in chronic kidney disease: Secondary | ICD-10-CM | POA: Diagnosis not present

## 2020-03-09 DIAGNOSIS — I129 Hypertensive chronic kidney disease with stage 1 through stage 4 chronic kidney disease, or unspecified chronic kidney disease: Secondary | ICD-10-CM | POA: Diagnosis not present

## 2020-03-09 DIAGNOSIS — Z940 Kidney transplant status: Secondary | ICD-10-CM | POA: Diagnosis not present

## 2020-03-09 DIAGNOSIS — R69 Illness, unspecified: Secondary | ICD-10-CM | POA: Diagnosis not present

## 2020-03-21 ENCOUNTER — Other Ambulatory Visit: Payer: Self-pay | Admitting: Internal Medicine

## 2020-03-29 DIAGNOSIS — Z4822 Encounter for aftercare following kidney transplant: Secondary | ICD-10-CM | POA: Diagnosis not present

## 2020-03-29 DIAGNOSIS — Z79899 Other long term (current) drug therapy: Secondary | ICD-10-CM | POA: Diagnosis not present

## 2020-04-26 DIAGNOSIS — G4700 Insomnia, unspecified: Secondary | ICD-10-CM | POA: Diagnosis not present

## 2020-04-26 DIAGNOSIS — Z940 Kidney transplant status: Secondary | ICD-10-CM | POA: Diagnosis not present

## 2020-04-26 DIAGNOSIS — E119 Type 2 diabetes mellitus without complications: Secondary | ICD-10-CM | POA: Diagnosis not present

## 2020-04-26 DIAGNOSIS — E785 Hyperlipidemia, unspecified: Secondary | ICD-10-CM | POA: Diagnosis not present

## 2020-04-26 DIAGNOSIS — I10 Essential (primary) hypertension: Secondary | ICD-10-CM | POA: Diagnosis not present

## 2020-04-26 DIAGNOSIS — D72819 Decreased white blood cell count, unspecified: Secondary | ICD-10-CM | POA: Diagnosis not present

## 2020-04-26 DIAGNOSIS — E872 Acidosis: Secondary | ICD-10-CM | POA: Diagnosis not present

## 2020-04-26 DIAGNOSIS — D849 Immunodeficiency, unspecified: Secondary | ICD-10-CM | POA: Diagnosis not present

## 2020-04-26 DIAGNOSIS — R69 Illness, unspecified: Secondary | ICD-10-CM | POA: Diagnosis not present

## 2020-04-26 DIAGNOSIS — Z7952 Long term (current) use of systemic steroids: Secondary | ICD-10-CM | POA: Diagnosis not present

## 2020-04-26 DIAGNOSIS — Z4822 Encounter for aftercare following kidney transplant: Secondary | ICD-10-CM | POA: Diagnosis not present

## 2020-04-26 DIAGNOSIS — R945 Abnormal results of liver function studies: Secondary | ICD-10-CM | POA: Diagnosis not present

## 2020-04-27 DIAGNOSIS — Z4822 Encounter for aftercare following kidney transplant: Secondary | ICD-10-CM | POA: Diagnosis not present

## 2020-04-27 DIAGNOSIS — Z5189 Encounter for other specified aftercare: Secondary | ICD-10-CM | POA: Diagnosis not present

## 2020-04-27 DIAGNOSIS — Z79899 Other long term (current) drug therapy: Secondary | ICD-10-CM | POA: Diagnosis not present

## 2020-04-27 DIAGNOSIS — Z5181 Encounter for therapeutic drug level monitoring: Secondary | ICD-10-CM | POA: Diagnosis not present

## 2020-05-29 DIAGNOSIS — Z792 Long term (current) use of antibiotics: Secondary | ICD-10-CM | POA: Diagnosis not present

## 2020-05-29 DIAGNOSIS — B20 Human immunodeficiency virus [HIV] disease: Secondary | ICD-10-CM | POA: Diagnosis not present

## 2020-05-29 DIAGNOSIS — R69 Illness, unspecified: Secondary | ICD-10-CM | POA: Diagnosis not present

## 2020-05-29 DIAGNOSIS — N186 End stage renal disease: Secondary | ICD-10-CM | POA: Diagnosis not present

## 2020-05-29 DIAGNOSIS — Z79899 Other long term (current) drug therapy: Secondary | ICD-10-CM | POA: Diagnosis not present

## 2020-05-29 DIAGNOSIS — Z940 Kidney transplant status: Secondary | ICD-10-CM | POA: Diagnosis not present

## 2020-05-29 DIAGNOSIS — I10 Essential (primary) hypertension: Secondary | ICD-10-CM | POA: Diagnosis not present

## 2020-05-29 DIAGNOSIS — Z7952 Long term (current) use of systemic steroids: Secondary | ICD-10-CM | POA: Diagnosis not present

## 2020-05-29 DIAGNOSIS — Z4822 Encounter for aftercare following kidney transplant: Secondary | ICD-10-CM | POA: Diagnosis not present

## 2020-05-29 DIAGNOSIS — E118 Type 2 diabetes mellitus with unspecified complications: Secondary | ICD-10-CM | POA: Diagnosis not present

## 2020-05-29 DIAGNOSIS — R61 Generalized hyperhidrosis: Secondary | ICD-10-CM | POA: Diagnosis not present

## 2020-05-29 DIAGNOSIS — R0982 Postnasal drip: Secondary | ICD-10-CM | POA: Diagnosis not present

## 2020-05-29 DIAGNOSIS — Z5181 Encounter for therapeutic drug level monitoring: Secondary | ICD-10-CM | POA: Diagnosis not present

## 2020-05-29 DIAGNOSIS — D702 Other drug-induced agranulocytosis: Secondary | ICD-10-CM | POA: Diagnosis not present

## 2020-05-29 DIAGNOSIS — D849 Immunodeficiency, unspecified: Secondary | ICD-10-CM | POA: Diagnosis not present

## 2020-06-12 DIAGNOSIS — D631 Anemia in chronic kidney disease: Secondary | ICD-10-CM | POA: Diagnosis not present

## 2020-06-12 DIAGNOSIS — N2581 Secondary hyperparathyroidism of renal origin: Secondary | ICD-10-CM | POA: Diagnosis not present

## 2020-06-12 DIAGNOSIS — I129 Hypertensive chronic kidney disease with stage 1 through stage 4 chronic kidney disease, or unspecified chronic kidney disease: Secondary | ICD-10-CM | POA: Diagnosis not present

## 2020-06-12 DIAGNOSIS — N189 Chronic kidney disease, unspecified: Secondary | ICD-10-CM | POA: Diagnosis not present

## 2020-06-12 DIAGNOSIS — Z940 Kidney transplant status: Secondary | ICD-10-CM | POA: Diagnosis not present

## 2020-06-26 ENCOUNTER — Other Ambulatory Visit: Payer: Self-pay | Admitting: Internal Medicine

## 2020-06-26 DIAGNOSIS — I10 Essential (primary) hypertension: Secondary | ICD-10-CM | POA: Diagnosis not present

## 2020-06-26 DIAGNOSIS — Z7952 Long term (current) use of systemic steroids: Secondary | ICD-10-CM | POA: Diagnosis not present

## 2020-06-26 DIAGNOSIS — R69 Illness, unspecified: Secondary | ICD-10-CM | POA: Diagnosis not present

## 2020-06-26 DIAGNOSIS — M109 Gout, unspecified: Secondary | ICD-10-CM | POA: Diagnosis not present

## 2020-06-26 DIAGNOSIS — Z4822 Encounter for aftercare following kidney transplant: Secondary | ICD-10-CM | POA: Diagnosis not present

## 2020-06-26 DIAGNOSIS — N390 Urinary tract infection, site not specified: Secondary | ICD-10-CM | POA: Diagnosis not present

## 2020-06-26 DIAGNOSIS — Z79899 Other long term (current) drug therapy: Secondary | ICD-10-CM | POA: Diagnosis not present

## 2020-06-26 DIAGNOSIS — G4700 Insomnia, unspecified: Secondary | ICD-10-CM | POA: Diagnosis not present

## 2020-06-26 DIAGNOSIS — E119 Type 2 diabetes mellitus without complications: Secondary | ICD-10-CM | POA: Diagnosis not present

## 2020-06-26 DIAGNOSIS — Z792 Long term (current) use of antibiotics: Secondary | ICD-10-CM | POA: Diagnosis not present

## 2020-06-27 DIAGNOSIS — R32 Unspecified urinary incontinence: Secondary | ICD-10-CM | POA: Diagnosis not present

## 2020-06-27 DIAGNOSIS — E663 Overweight: Secondary | ICD-10-CM | POA: Diagnosis not present

## 2020-06-27 DIAGNOSIS — Z008 Encounter for other general examination: Secondary | ICD-10-CM | POA: Diagnosis not present

## 2020-06-27 DIAGNOSIS — T7840XD Allergy, unspecified, subsequent encounter: Secondary | ICD-10-CM | POA: Diagnosis not present

## 2020-06-27 DIAGNOSIS — E785 Hyperlipidemia, unspecified: Secondary | ICD-10-CM | POA: Diagnosis not present

## 2020-06-27 DIAGNOSIS — Z6828 Body mass index (BMI) 28.0-28.9, adult: Secondary | ICD-10-CM | POA: Diagnosis not present

## 2020-06-27 DIAGNOSIS — I10 Essential (primary) hypertension: Secondary | ICD-10-CM | POA: Diagnosis not present

## 2020-06-27 DIAGNOSIS — F419 Anxiety disorder, unspecified: Secondary | ICD-10-CM | POA: Diagnosis not present

## 2020-06-27 DIAGNOSIS — R69 Illness, unspecified: Secondary | ICD-10-CM | POA: Diagnosis not present

## 2020-06-27 DIAGNOSIS — M1990 Unspecified osteoarthritis, unspecified site: Secondary | ICD-10-CM | POA: Diagnosis not present

## 2020-07-24 DIAGNOSIS — R69 Illness, unspecified: Secondary | ICD-10-CM | POA: Diagnosis not present

## 2020-07-24 DIAGNOSIS — Z4822 Encounter for aftercare following kidney transplant: Secondary | ICD-10-CM | POA: Diagnosis not present

## 2020-07-24 DIAGNOSIS — Z79899 Other long term (current) drug therapy: Secondary | ICD-10-CM | POA: Diagnosis not present

## 2020-07-24 DIAGNOSIS — Z23 Encounter for immunization: Secondary | ICD-10-CM | POA: Diagnosis not present

## 2020-07-24 DIAGNOSIS — B20 Human immunodeficiency virus [HIV] disease: Secondary | ICD-10-CM | POA: Diagnosis not present

## 2020-08-03 ENCOUNTER — Ambulatory Visit (INDEPENDENT_AMBULATORY_CARE_PROVIDER_SITE_OTHER): Payer: Medicare HMO | Admitting: Gastroenterology

## 2020-08-03 ENCOUNTER — Encounter: Payer: Self-pay | Admitting: Gastroenterology

## 2020-08-03 VITALS — BP 126/66 | HR 76 | Ht 66.5 in | Wt 184.1 lb

## 2020-08-03 DIAGNOSIS — K648 Other hemorrhoids: Secondary | ICD-10-CM | POA: Diagnosis not present

## 2020-08-03 DIAGNOSIS — K5904 Chronic idiopathic constipation: Secondary | ICD-10-CM

## 2020-08-03 NOTE — Progress Notes (Signed)
    History of Present Illness: This is a 54 year old female complaining of mucus per rectum, mild constipation, occasional rectal pain and occasional rectal bleeding.  She has a history of bleeding grade 2 internal hemorrhoids and underwent 1 session of band ligation in November 2020.  The RA internal hemorrhoid was banded.  She states she has not had prolapse since that time however occasional rectal bleeding and daily mucus per rectum persist.  Her infrequent intense rectal pain was felt to be proctalgia fugax.  She has not used any medications for hemorrhoid symptoms or constipation recently.  Current Medications, Allergies, Past Medical History, Past Surgical History, Family History and Social History were reviewed in [employer] record.   Physical Exam: General: Well developed, well nourished, no acute distress Head: Normocephalic and atraumatic Eyes:  sclerae anicteric, EOMI Ears: Normal auditory acuity Mouth: Not examined, mask on during Covid-19 pandemic Lungs: Clear throughout to auscultation Heart: Regular rate and rhythm; no murmurs, rubs or bruits Abdomen: Soft, non tender and non distended. No masses, hepatosplenomegaly or hernias noted. Normal Bowel sounds Rectal: moderate external tag, mild tenderness.  Anoscopy: moderate internal hemorrhoids RP > LL > RA.  Hypertrophied anal papilla Musculoskeletal: Symmetrical with no gross deformities  Pulses:  Normal pulses noted Extremities: No clubbing, cyanosis, edema or deformities noted Neurological: Alert oriented x 4, grossly nonfocal Psychological:  Alert and cooperative. Normal mood and affect   Assessment and Recommendations:  1.  Previous grade 2 internal hemorrhoids now grade 1 with intermittent bleeding and frequent leakage.  Trial of Benefiber daily, MiraLAX 1/2-1 scoop daily and Preparation H cream PR daily for 2 weeks.  If symptoms persist continue with hemorrhoidal banding.  The RA hemorrhoid may  need to be rebanded.

## 2020-08-03 NOTE — Patient Instructions (Addendum)
If you are age 55 or older, your body mass index should be between 23-30. Your Body mass index is 29.27 kg/m. If this is out of the aforementioned range listed, please consider follow up with your Primary Care Provider.  If you are age 47 or younger, your body mass index should be between 19-25. Your Body mass index is 29.27 kg/m. If this is out of the aformentioned range listed, please consider follow up with your Primary Care Provider.   Start Miralax 1/2-1 whole scoop everyday   Start Benfiber everyday   Continue  Preparation H Cream for rectum   If your symptoms have improved you can cancel your banding appointment  Thank you for choosing me and [facility].  Pricilla Riffle. Dagoberto Ligas., MD., [facility]

## 2020-08-06 DIAGNOSIS — Z01419 Encounter for gynecological examination (general) (routine) without abnormal findings: Secondary | ICD-10-CM | POA: Diagnosis not present

## 2020-08-06 DIAGNOSIS — Z779 Other contact with and (suspected) exposures hazardous to health: Secondary | ICD-10-CM | POA: Diagnosis not present

## 2020-08-06 DIAGNOSIS — N952 Postmenopausal atrophic vaginitis: Secondary | ICD-10-CM | POA: Diagnosis not present

## 2020-08-06 DIAGNOSIS — Z6829 Body mass index (BMI) 29.0-29.9, adult: Secondary | ICD-10-CM | POA: Diagnosis not present

## 2020-08-06 DIAGNOSIS — N879 Dysplasia of cervix uteri, unspecified: Secondary | ICD-10-CM | POA: Diagnosis not present

## 2020-08-06 DIAGNOSIS — R319 Hematuria, unspecified: Secondary | ICD-10-CM | POA: Diagnosis not present

## 2020-08-06 DIAGNOSIS — Z1231 Encounter for screening mammogram for malignant neoplasm of breast: Secondary | ICD-10-CM | POA: Diagnosis not present

## 2020-08-20 ENCOUNTER — Encounter: Payer: Medicare HMO | Admitting: Gastroenterology

## 2020-08-21 DIAGNOSIS — Y830 Surgical operation with transplant of whole organ as the cause of abnormal reaction of the patient, or of later complication, without mention of misadventure at the time of the procedure: Secondary | ICD-10-CM | POA: Diagnosis not present

## 2020-08-21 DIAGNOSIS — D631 Anemia in chronic kidney disease: Secondary | ICD-10-CM | POA: Diagnosis not present

## 2020-08-21 DIAGNOSIS — T8610 Unspecified complication of kidney transplant: Secondary | ICD-10-CM | POA: Diagnosis not present

## 2020-08-21 DIAGNOSIS — G4700 Insomnia, unspecified: Secondary | ICD-10-CM | POA: Diagnosis not present

## 2020-08-21 DIAGNOSIS — R300 Dysuria: Secondary | ICD-10-CM | POA: Diagnosis not present

## 2020-08-21 DIAGNOSIS — I120 Hypertensive chronic kidney disease with stage 5 chronic kidney disease or end stage renal disease: Secondary | ICD-10-CM | POA: Diagnosis not present

## 2020-08-21 DIAGNOSIS — E119 Type 2 diabetes mellitus without complications: Secondary | ICD-10-CM | POA: Diagnosis not present

## 2020-08-21 DIAGNOSIS — E1122 Type 2 diabetes mellitus with diabetic chronic kidney disease: Secondary | ICD-10-CM | POA: Diagnosis not present

## 2020-08-21 DIAGNOSIS — I10 Essential (primary) hypertension: Secondary | ICD-10-CM | POA: Diagnosis not present

## 2020-08-21 DIAGNOSIS — Z4822 Encounter for aftercare following kidney transplant: Secondary | ICD-10-CM | POA: Diagnosis not present

## 2020-08-21 DIAGNOSIS — N186 End stage renal disease: Secondary | ICD-10-CM | POA: Diagnosis not present

## 2020-08-21 DIAGNOSIS — D72819 Decreased white blood cell count, unspecified: Secondary | ICD-10-CM | POA: Diagnosis not present

## 2020-08-21 DIAGNOSIS — D849 Immunodeficiency, unspecified: Secondary | ICD-10-CM | POA: Diagnosis not present

## 2020-08-21 DIAGNOSIS — Z940 Kidney transplant status: Secondary | ICD-10-CM | POA: Diagnosis not present

## 2020-08-22 DIAGNOSIS — E119 Type 2 diabetes mellitus without complications: Secondary | ICD-10-CM | POA: Diagnosis not present

## 2020-08-22 DIAGNOSIS — H524 Presbyopia: Secondary | ICD-10-CM | POA: Diagnosis not present

## 2020-09-18 DIAGNOSIS — Z4822 Encounter for aftercare following kidney transplant: Secondary | ICD-10-CM | POA: Diagnosis not present

## 2020-09-18 DIAGNOSIS — Z79899 Other long term (current) drug therapy: Secondary | ICD-10-CM | POA: Diagnosis not present

## 2020-09-20 ENCOUNTER — Other Ambulatory Visit: Payer: Self-pay | Admitting: Internal Medicine

## 2020-10-01 DIAGNOSIS — R300 Dysuria: Secondary | ICD-10-CM | POA: Diagnosis not present

## 2020-10-01 DIAGNOSIS — Z940 Kidney transplant status: Secondary | ICD-10-CM | POA: Diagnosis not present

## 2020-10-01 DIAGNOSIS — N858 Other specified noninflammatory disorders of uterus: Secondary | ICD-10-CM | POA: Diagnosis not present

## 2020-10-01 DIAGNOSIS — N3281 Overactive bladder: Secondary | ICD-10-CM | POA: Diagnosis not present

## 2020-10-01 DIAGNOSIS — R82998 Other abnormal findings in urine: Secondary | ICD-10-CM | POA: Diagnosis not present

## 2020-10-08 DIAGNOSIS — Z0100 Encounter for examination of eyes and vision without abnormal findings: Secondary | ICD-10-CM | POA: Diagnosis not present

## 2020-10-25 DIAGNOSIS — Z4822 Encounter for aftercare following kidney transplant: Secondary | ICD-10-CM | POA: Diagnosis not present

## 2020-10-25 DIAGNOSIS — Z940 Kidney transplant status: Secondary | ICD-10-CM | POA: Diagnosis not present

## 2020-10-25 DIAGNOSIS — Z79899 Other long term (current) drug therapy: Secondary | ICD-10-CM | POA: Diagnosis not present

## 2020-11-01 DIAGNOSIS — Z79899 Other long term (current) drug therapy: Secondary | ICD-10-CM | POA: Diagnosis not present

## 2020-11-01 DIAGNOSIS — Z940 Kidney transplant status: Secondary | ICD-10-CM | POA: Diagnosis not present

## 2020-11-05 DIAGNOSIS — T8612 Kidney transplant failure: Secondary | ICD-10-CM | POA: Diagnosis not present

## 2020-11-05 DIAGNOSIS — Y658 Other specified misadventures during surgical and medical care: Secondary | ICD-10-CM | POA: Diagnosis not present

## 2020-11-05 DIAGNOSIS — N189 Chronic kidney disease, unspecified: Secondary | ICD-10-CM | POA: Diagnosis not present

## 2020-11-05 DIAGNOSIS — Z79899 Other long term (current) drug therapy: Secondary | ICD-10-CM | POA: Diagnosis not present

## 2020-11-05 DIAGNOSIS — Z940 Kidney transplant status: Secondary | ICD-10-CM | POA: Diagnosis not present

## 2020-11-05 DIAGNOSIS — R69 Illness, unspecified: Secondary | ICD-10-CM | POA: Diagnosis not present

## 2020-11-05 DIAGNOSIS — N289 Disorder of kidney and ureter, unspecified: Secondary | ICD-10-CM | POA: Diagnosis not present

## 2020-11-06 DIAGNOSIS — Z9889 Other specified postprocedural states: Secondary | ICD-10-CM | POA: Diagnosis not present

## 2020-11-06 DIAGNOSIS — Z4822 Encounter for aftercare following kidney transplant: Secondary | ICD-10-CM | POA: Diagnosis not present

## 2020-11-06 DIAGNOSIS — Z79899 Other long term (current) drug therapy: Secondary | ICD-10-CM | POA: Diagnosis not present

## 2020-12-12 ENCOUNTER — Other Ambulatory Visit: Payer: Self-pay | Admitting: Internal Medicine

## 2021-02-26 ENCOUNTER — Other Ambulatory Visit: Payer: Self-pay

## 2021-02-26 ENCOUNTER — Ambulatory Visit (INDEPENDENT_AMBULATORY_CARE_PROVIDER_SITE_OTHER): Payer: Medicare HMO

## 2021-02-26 ENCOUNTER — Encounter: Payer: Self-pay | Admitting: Orthopaedic Surgery

## 2021-02-26 ENCOUNTER — Ambulatory Visit: Payer: Medicare HMO | Admitting: Orthopaedic Surgery

## 2021-02-26 DIAGNOSIS — M7062 Trochanteric bursitis, left hip: Secondary | ICD-10-CM | POA: Diagnosis not present

## 2021-02-26 MED ORDER — LIDOCAINE HCL 1 % IJ SOLN
3.0000 mL | INTRAMUSCULAR | Status: AC | PRN
  Administered 2021-02-26: 3 mL

## 2021-02-26 MED ORDER — METHYLPREDNISOLONE ACETATE 40 MG/ML IJ SUSP
40.0000 mg | INTRAMUSCULAR | Status: AC | PRN
  Administered 2021-02-26: 40 mg via INTRA_ARTICULAR

## 2021-02-26 MED ORDER — BUPIVACAINE HCL 0.25 % IJ SOLN
2.0000 mL | INTRAMUSCULAR | Status: AC | PRN
  Administered 2021-02-26: 2 mL via INTRA_ARTICULAR

## 2021-02-26 NOTE — Progress Notes (Signed)
Office Visit Note   Patient: Krista Bean           Date of Birth: [birth year]           MRN: [id number] Visit Date: 02/26/2021              Requested by: Binnie Rail, MD [place],  [place] [id number] PCP: Binnie Rail, MD   Assessment & Plan: Visit Diagnoses:  1. Trochanteric bursitis of left hip     Plan: Impression is left hip trochanteric bursitis.  Today, we discussed repeat cortisone injection in addition to iliotibial band stretching.  She would like to proceed with both.  Handout provided today.  Follow-up with [place] as needed.  Follow-Up Instructions: Return if symptoms worsen or fail to improve.   Orders:  Orders Placed This Encounter  Procedures  . Large Joint Inj: L greater trochanter  . XR HIP UNILAT W OR W/O PELVIS 2-3 VIEWS LEFT   No orders of the defined types were placed in this encounter.     Procedures: Large Joint Inj: L greater trochanter on 02/26/2021 3:35 PM Indications: pain Details: 22 G needle, lateral approach Medications: 3 mL lidocaine 1 %; 2 mL bupivacaine 0.25 %; 40 mg methylPREDNISolone acetate 40 MG/ML      Clinical Data: No additional findings.   Subjective: Chief Complaint  Patient presents with  . Left Hip - Pain    HPI patient is a pleasant 55 year old female who comes in today with left lateral hip pain.  She has been dealing with this intermittently for the past 2 years.  She was seen by physician 2 years ago where trochanteric bursa injection was performed.  This significantly helped until about a week ago.  Her pain has returned.  Pain is worse with walking too far as well as sleeping normal left lateral side.  She has been taking Tylenol and Aleve without significant relief.  She denies any paresthesias to the left lower extremity.  Of note, she is status post right total hip replacement and is doing well there.  This was done about 5 years ago at [facility] as detailed in HPI.  All others  reviewed and are negative.   Objective: Vital Signs: There were no vitals taken for this visit.  Physical Exam well-developed well-nourished female in no acute distress.  Alert and oriented x3.  Ortho Exam left hip exam shows negative logroll negative FADIR.  Moderate tenderness to the greater trochanter.  She is neurovascular intact distally.  Specialty Comments:  No specialty comments available.  Imaging: XR HIP UNILAT W OR W/O PELVIS 2-3 VIEWS LEFT  Result Date: 02/26/2021 Mild joint space narrowing to the left hip    PMFS History: Patient Active Problem List   Diagnosis Date Noted  . External hemorrhoids 07/05/2019  . Degenerative arthritis of thumb, left 07/05/2019  . 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 Urinary incontinence 05/02/2019  . Anal discharge 05/02/2019  . Dysuria 05/02/2019  . Depression 08/24/2018  . Aortic valve regurgitation 10/24/2017  . Cardiac murmur 09/23/2017  . Shortness of breath 09/23/2017  . S/P hip replacement, right 08/04/2017  . Sleep difficulties 03/19/2016  . Prediabetes 03/05/2016  . Anxiety 06/09/2014  . Gout 09/13/2013  . End stage renal disease ([place]) 06/29/2012  . Avascular necrosis of femoral head ([place]) 09/13/2011  . Pulmonary hypertension ([place]) 09/09/2011  . KIDNEY TRANSPLANTATION, HX OF 12/08/2007  . PERIPHERAL NEUROPATHY, FEET 12/28/2006  . HIV (  human immunodeficiency virus infection) ([place]) 07/21/2006   Past Medical History:  Diagnosis Date  . Allergy    seasonal  . Blood transfusion without reported diagnosis   . ESRD (end stage renal disease) ([place])   . Heart murmur   . HIV infection ([place])   . Hyperglycemia   . Peripheral neuropathy    Feet  . Renal failure    Hx. of Kidney transplantation    Family History  Problem Relation Age of Onset  . Diabetes Father   . Colon cancer Neg Hx   . Colon polyps Neg Hx   . Esophageal cancer Neg Hx   . Rectal cancer Neg Hx   . Stomach cancer Neg Hx     Past Surgical History:   Procedure Laterality Date  . BREAST REDUCTION SURGERY    . HEMORROIDECTOMY     unsure of year but thinks 69  . KIDNEY TRANSPLANT Left 05-2014  . KIDNEY TRANSPLANT Right 2010  . REDUCTION MAMMAPLASTY    . SHUNTOGRAM N/A 07/01/2012   Procedure: Earney Mallet;  Surgeon: Conrad [place], MD;  Location: [facility] C[facility] LAB;  Service: Cardiovascular;  Laterality: N/A;  . TOTAL HIP ARTHROPLASTY Right 11/2016  . Transposition AVF  Feb. 22, 2011   Right  by Dr. Rosalia Hammers  . TUBAL LIGATION  1993   Social History   Occupational History  . Not on file  Tobacco Use  . Smoking status: Never Smoker  . Smokeless tobacco: Never Used  Vaping Use  . Vaping Use: Never used  Substance and Sexual Activity  . Alcohol use: No    Alcohol/week: 0.0 standard drinks  . Drug use: No  . Sexual activity: Not on file

## 2021-03-01 ENCOUNTER — Ambulatory Visit: Payer: Medicare HMO | Admitting: Orthopaedic Surgery

## 2021-04-18 ENCOUNTER — Ambulatory Visit: Payer: Self-pay

## 2021-04-18 ENCOUNTER — Ambulatory Visit: Payer: Medicare HMO | Admitting: Orthopaedic Surgery

## 2021-04-18 ENCOUNTER — Encounter: Payer: Self-pay | Admitting: Orthopaedic Surgery

## 2021-04-18 DIAGNOSIS — M25552 Pain in left hip: Secondary | ICD-10-CM

## 2021-04-18 NOTE — Progress Notes (Signed)
Subjective: Patient is here for ultrasound-guided intra-articular left hip injection.   She did not get much relief from greater trochanter injection, so this is a diagnostic and hopefully therapeutic intra-articular injection.  Procedure: Ultrasound guided injection is preferred based studies that show increased duration, increased effect, greater accuracy, decreased procedural pain, increased response rate, and decreased cost with ultrasound guided versus blind injection.   Verbal informed consent obtained.  Time-out conducted.  Noted no overlying erythema, induration, or other signs of local infection. Ultrasound-guided left hip injection: After sterile prep with Betadine, injected 4 cc 0.25% bupivacaine without epinephrine and 6 mg betamethasone using a 22-gauge spinal needle, passing the needle through the iliofemoral ligament into the femoral head/neck junction.  Injectate seen filling the joint capsule.

## 2021-04-18 NOTE — Progress Notes (Signed)
Office Visit Note   Patient: Krista Bean           Date of Birth: 02-26-1966           MRN: [id number] Visit Date: 04/18/2021              Requested by: Binnie Rail, MD [place],  [place] [address] PCP: Binnie Rail, MD   Assessment & Plan: Visit Diagnoses:  1. Pain in left hip     Plan: Impression is left hip pain due to trochanteric bursitis versus gluteal tendinosis possible intra-articular pathology. We discussed next steps including a repeat trochanteric bursa injection versus ordering an MRI of the hip to evaluate the integrity of the gluteal tendons. However I don't believe the MRI would change our course of treatment as this time. Another option would be to do a intra-articular hip injection for diagnostic/therapeutic purposes since she does describe some pain deep in her hip. She would like to proceed with the intra-articular injection. We will try to get her in with Dr. Junius Roads today. We'd like to see her back in 4 weeks if this injection does not help. If it does we will see her back as needed.   Follow-Up Instructions: Follow up in 4 weeks if no relief from the injection, otherwise follow up as needed.   Orders:  Orders Placed This Encounter  Procedures   US Guided Needle Placement - No Linked Charges   No orders of the defined types were placed in this encounter.    Procedures: No procedures performed   Clinical Data: No additional findings.   Subjective: Chief Complaint  Patient presents with   Left Hip - Pain, Follow-up    HPI Krista Bean is a 55 y.o. female who presents for follow up of chronic left hip pain. She underwent a cortisone injection for trochanteric bursitis on 02/26/21. She reports that this did not provide any pain relief. Her pain returned that same day when she was walking around the mall. Today she describes lateral left hip pain that is worse with walking and stairs, and improves with sitting. She does not have pain  with lying on her left side. She also describes some pain "deep" in her hip. She has been taking Tylenol as needed for pain relief. She also did home therapy exercises but did not find this helpful. She has not done any formal physical therapy.   Review of Systems Review of Systems was reviewed and negative unless as stated in the HPI.  Objective: Vital Signs: There were no vitals taken for this visit.  Physical Exam Vitals and nursing note reviewed.  Constitutional:      Appearance: She is well-developed.  Pulmonary:     Effort: Pulmonary effort is normal.  Skin:    General: Skin is warm.     Capillary Refill: Capillary refill takes less than 2 seconds.  Neurological:     Mental Status: She is alert and oriented to person, place, and time.  Psychiatric:        Behavior: Behavior normal.        Thought Content: Thought content normal.        Judgment: Judgment normal.    Ortho Exam Left hip exam demonstrates tenderness to palpation over the trochanteric bursa. Negative log roll, Corky Sox, Fadir and figure 4. Pain free hip ROM. 5/5 strength with resisted hip flexion and abduction. No pain with strength testing. Distal neurovascular exam intact.  Specialty Comments:  No specialty comments available.  Imaging: No new imaging.   PMFS History: Patient Active Problem List   Diagnosis Date Noted   External hemorrhoids 07/05/2019   Degenerative arthritis of thumb, left 07/05/2019   Hand arthritis 05/02/2019   Urinary incontinence 05/02/2019   Anal discharge 05/02/2019   Dysuria 05/02/2019   Depression 08/24/2018   Aortic valve regurgitation 10/24/2017   Cardiac murmur 09/23/2017   Shortness of breath 09/23/2017   S/P hip replacement, right 08/04/2017   Sleep difficulties 03/19/2016   Prediabetes 03/05/2016   Anxiety 06/09/2014   Gout 09/13/2013   End stage renal disease ([place]) 06/29/2012   Avascular necrosis of femoral head ([place]) 09/13/2011   Pulmonary hypertension ([place])  09/09/2011   KIDNEY TRANSPLANTATION, HX OF 12/08/2007   PERIPHERAL NEUROPATHY, FEET 12/28/2006   HIV (human immunodeficiency virus infection) ([place]) 07/21/2006   Past Medical History:  Diagnosis Date   Allergy    seasonal   Blood transfusion without reported diagnosis    ESRD (end stage renal disease) ([place])    Heart murmur    HIV infection ([place])    Hyperglycemia    Peripheral neuropathy    Feet   Renal failure    Hx. of Kidney transplantation    Family History  Problem Relation Age of Onset   Diabetes Father    Colon cancer Neg Hx    Colon polyps Neg Hx    Esophageal cancer Neg Hx    Rectal cancer Neg Hx    Stomach cancer Neg Hx     Past Surgical History:  Procedure Laterality Date   BREAST REDUCTION SURGERY     HEMORROIDECTOMY     unsure of year but thinks 1997   KIDNEY TRANSPLANT Left 05-2014   KIDNEY TRANSPLANT Right 2010   REDUCTION MAMMAPLASTY     SHUNTOGRAM N/A 07/01/2012   Procedure: Earney Mallet;  Surgeon: Conrad Sunbury, MD;  Location: [facility] CATH LAB;  Service: Cardiovascular;  Laterality: N/A;   TOTAL HIP ARTHROPLASTY Right 11/2016   Transposition AVF  Feb. 22, 2011   Right  by Dr. Rosalia Hammers   TUBAL LIGATION  1993   Social History   Occupational History   Not on file  Tobacco Use   Smoking status: Never   Smokeless tobacco: Never  Vaping Use   Vaping Use: Never used  Substance and Sexual Activity   Alcohol use: No    Alcohol/week: 0.0 standard drinks   Drug use: No   Sexual activity: Not on file

## 2021-05-17 ENCOUNTER — Emergency Department (HOSPITAL_COMMUNITY)
Admission: EM | Admit: 2021-05-17 | Discharge: 2021-05-18 | Disposition: A | Discharge status: Home / Self Care | Payer: Medicare Other | Attending: Emergency Medicine | Admitting: Emergency Medicine

## 2021-05-17 ENCOUNTER — Encounter (HOSPITAL_COMMUNITY): Payer: Self-pay | Admitting: *Deleted

## 2021-05-17 ENCOUNTER — Other Ambulatory Visit: Payer: Self-pay

## 2021-05-17 ENCOUNTER — Emergency Department (HOSPITAL_COMMUNITY): Payer: Medicare Other

## 2021-05-17 ENCOUNTER — Encounter (HOSPITAL_COMMUNITY): Payer: Self-pay

## 2021-05-17 DIAGNOSIS — R0602 Shortness of breath: Secondary | ICD-10-CM | POA: Diagnosis present

## 2021-05-17 DIAGNOSIS — U071 COVID-19: Secondary | ICD-10-CM | POA: Diagnosis not present

## 2021-05-17 DIAGNOSIS — Z940 Kidney transplant status: Secondary | ICD-10-CM | POA: Diagnosis not present

## 2021-05-17 DIAGNOSIS — N186 End stage renal disease: Secondary | ICD-10-CM | POA: Insufficient documentation

## 2021-05-17 DIAGNOSIS — Z21 Asymptomatic human immunodeficiency virus [HIV] infection status: Secondary | ICD-10-CM | POA: Diagnosis not present

## 2021-05-17 DIAGNOSIS — Z7982 Long term (current) use of aspirin: Secondary | ICD-10-CM | POA: Insufficient documentation

## 2021-05-17 DIAGNOSIS — Z96641 Presence of right artificial hip joint: Secondary | ICD-10-CM | POA: Diagnosis not present

## 2021-05-17 DIAGNOSIS — Z79899 Other long term (current) drug therapy: Secondary | ICD-10-CM | POA: Diagnosis not present

## 2021-05-17 DIAGNOSIS — I120 Hypertensive chronic kidney disease with stage 5 chronic kidney disease or end stage renal disease: Secondary | ICD-10-CM | POA: Diagnosis not present

## 2021-05-17 LAB — CBC WITH DIFFERENTIAL/PLATELET
Abs Immature Granulocytes: 0.02 10*3/uL (ref 0.00–0.07)
Basophils Absolute: 0 10*3/uL (ref 0.0–0.1)
Basophils Relative: 0 %
Eosinophils Absolute: 0.1 10*3/uL (ref 0.0–0.5)
Eosinophils Relative: 2 %
HCT: 33.6 % — ABNORMAL LOW (ref 36.0–46.0)
Hemoglobin: 11 g/dL — ABNORMAL LOW (ref 12.0–15.0)
Immature Granulocytes: 0 %
Lymphocytes Relative: 36 %
Lymphs Abs: 1.6 10*3/uL (ref 0.7–4.0)
MCH: 29.2 pg (ref 26.0–34.0)
MCHC: 32.7 g/dL (ref 30.0–36.0)
MCV: 89.1 fL (ref 80.0–100.0)
Monocytes Absolute: 0.5 10*3/uL (ref 0.1–1.0)
Monocytes Relative: 10 %
Neutro Abs: 2.3 10*3/uL (ref 1.7–7.7)
Neutrophils Relative %: 52 %
Platelets: 143 10*3/uL — ABNORMAL LOW (ref 150–400)
RBC: 3.77 MIL/uL — ABNORMAL LOW (ref 3.87–5.11)
RDW: 14 % (ref 11.5–15.5)
WBC: 4.6 10*3/uL (ref 4.0–10.5)
nRBC: 0 % (ref 0.0–0.2)

## 2021-05-17 LAB — COMPREHENSIVE METABOLIC PANEL
ALT: 19 U/L (ref 0–44)
AST: 32 U/L (ref 15–41)
Albumin: 3.5 g/dL (ref 3.5–5.0)
Alkaline Phosphatase: 135 U/L — ABNORMAL HIGH (ref 38–126)
Anion gap: 9 (ref 5–15)
BUN: 32 mg/dL — ABNORMAL HIGH (ref 6–20)
CO2: 16 mmol/L — ABNORMAL LOW (ref 22–32)
Calcium: 9.3 mg/dL (ref 8.9–10.3)
Chloride: 109 mmol/L (ref 98–111)
Creatinine, Ser: 3.46 mg/dL — ABNORMAL HIGH (ref 0.44–1.00)
GFR, Estimated: 15 mL/min — ABNORMAL LOW (ref >60)
Glucose, Bld: 111 mg/dL — ABNORMAL HIGH (ref 70–99)
Potassium: 4.5 mmol/L (ref 3.5–5.1)
Sodium: 134 mmol/L — ABNORMAL LOW (ref 135–145)
Total Bilirubin: 0.5 mg/dL (ref 0.3–1.2)
Total Protein: 6.2 g/dL — ABNORMAL LOW (ref 6.5–8.1)

## 2021-05-17 NOTE — ED Notes (Signed)
Pt maintained an O2 of 100 with 0L of oxygen, before ambulating. Pt sat up and continued to stat at 100. After instructing the patient to walk around the room for about 5 minutes and stand in place, they maintained an O2 of 98 without oxygen.

## 2021-05-17 NOTE — ED Triage Notes (Signed)
The pt has been ill since Monday cough some difficulty breathing since Monday  she saw her doctor today and tested positive for covid her cough is better today

## 2021-05-17 NOTE — ED Provider Notes (Signed)
Emergency Medicine Provider Triage Evaluation Note  Krista Bean , a 55 y.o. female  was evaluated in triage.  Pt complains of shortness of breath.  Patient states that she has had shortness of breath, nonproductive cough, and rhinorrhea since Monday.  Today patient tested positive for COVID-19.  Patient states that she came to the emergency department today due to her shortness of breath.  Patient denies any chest pain, leg swelling, palpitations, hemoptysis, hormone therapy, history of DVT or PE, surgery last 4 weeks, cancer treatment, 6 months.  Patient is on immunosuppression due to previous renal transplant.  Patient is willing to start antiviral medication.  Review of Systems  Positive: Shortness of breath, nonproductive cough, rhinorrhea Negative: hest pain, leg swelling, palpitations, hemoptysis,  Physical Exam  BP (!) 153/74 (BP Location: Left Arm)   Pulse 75   Temp 100 F (37.8 C) (Oral)   Resp 20   Ht 5\' 6"  (1.676 m)   Wt 83.5 kg   SpO2 100%   BMI 29.71 kg/m  Gen:   Awake, no distress   Resp:  Normal effort, lungs clear to auscultation bilaterally MSK:   Moves extremities without difficulty  Other:    Medical Decision Making  Medically screening exam initiated at 6:13 PM.  Appropriate orders placed.  SIANNA GAROFANO was informed that the remainder of the evaluation will be completed by another provider, this initial triage assessment does not replace that evaluation, and the importance of remaining in the ED until their evaluation is complete.  The patient appears stable so that the remainder of the work up may be completed by another provider.      Loni Beckwith, PA-C 05/17/21 1815    Drenda Freeze, MD 05/17/21 [phone]

## 2021-05-17 NOTE — ED Provider Notes (Signed)
[facility] EMERGENCY DEPARTMENT Provider Note   CSN: [id number] Arrival date & time: 05/17/21  1744     History Chief Complaint  Patient presents with   Cough   Covid Positive    Krista Bean is a 55 y.o. female.  55 year old female with a history of HIV, ESRD s/p renal transplant (on Myfortic, Prograf) presents to the emergency department with complaint of URI symptoms and shortness of breath.  She has been experiencing symptoms since on Monday.  Reports nonproductive cough as well as rhinorrhea.  She has been taking sinus medication for symptoms without relief.  Began to feel that she was more winded and fatigued with exertion.  She has no shortness of breath at rest.  Had a home positive COVID test yesterday.  Denies chest pain, leg swelling, palpitations, hemoptysis, syncope.  Has been vaccinated for COVID.  Never received a COVID booster.  The history is provided by the patient. No language interpreter was used.  Cough     Past Medical History:  Diagnosis Date   Allergy    seasonal   Blood transfusion without reported diagnosis    ESRD (end stage renal disease) ([place])    Heart murmur    HIV infection ([place])    Hyperglycemia    Peripheral neuropathy    Feet   Renal failure    Hx. of Kidney transplantation    Patient Active Problem List   Diagnosis Date Noted   External hemorrhoids 07/05/2019   Degenerative arthritis of thumb, left 07/05/2019   Hand arthritis 05/02/2019   Urinary incontinence 05/02/2019   Anal discharge 05/02/2019   Dysuria 05/02/2019   Depression 08/24/2018   Aortic valve regurgitation 10/24/2017   Cardiac murmur 09/23/2017   Shortness of breath 09/23/2017   S/P hip replacement, right 08/04/2017   Sleep difficulties 03/19/2016   Prediabetes 03/05/2016   Anxiety 06/09/2014   Gout 09/13/2013   End stage renal disease ([place]) 06/29/2012   Avascular necrosis of femoral head ([place]) 09/13/2011   Pulmonary hypertension ([place])  09/09/2011   KIDNEY TRANSPLANTATION, HX OF 12/08/2007   PERIPHERAL NEUROPATHY, FEET 12/28/2006   HIV (human immunodeficiency virus infection) ([place]) 07/21/2006    Past Surgical History:  Procedure Laterality Date   BREAST REDUCTION SURGERY     HEMORROIDECTOMY     unsure of year but thinks 1997   KIDNEY TRANSPLANT Left 05-2014   KIDNEY TRANSPLANT Right 2010   REDUCTION MAMMAPLASTY     SHUNTOGRAM N/A 07/01/2012   Procedure: Earney Mallet;  Surgeon: Conrad [place], MD;  Location: [facility] CATH LAB;  Service: Cardiovascular;  Laterality: N/A;   TOTAL HIP ARTHROPLASTY Right 11/2016   Transposition AVF  Feb. 22, 2011   Right  by Dr. Rosalia Hammers   TUBAL LIGATION  1993     OB History   No obstetric history on file.     Family History  Problem Relation Age of Onset   Diabetes Father    Colon cancer Neg Hx    Colon polyps Neg Hx    Esophageal cancer Neg Hx    Rectal cancer Neg Hx    Stomach cancer Neg Hx     Social History   Tobacco Use   Smoking status: Never   Smokeless tobacco: Never  Vaping Use   Vaping Use: Never used  Substance Use Topics   Alcohol use: No    Alcohol/week: 0.0 standard drinks   Drug use: No    Home Medications Prior to Admission medications  Medication Sig Start Date End Date Taking? Authorizing Provider  aspirin EC 81 MG tablet Take 81 mg by mouth. 06/13/14   [provider]  calcium acetate (PHOSLO) 667 MG capsule Take 2 capsules by mouth Three times a day after meals. 06/14/12   [provider]  JULUCA 50-25 MG TABS Take 1 tablet by mouth daily. 07/30/20   [provider]  labetalol (NORMODYNE) 200 MG tablet TAKE 1 TABLET (200 MG TOTAL) BY MOUTH 2 TIMES DAILY. 03/13/15   [provider]  metoprolol (LOPRESSOR) 100 MG tablet Take 50 mg by mouth 2 (two) times daily. Reported on 03/05/2016 03/18/12   [provider]  mycophenolate (MYFORTIC) 360 MG TBEC EC tablet Take 360 mg by mouth 2 (two) times daily.    [provider]  MYRBETRIQ 50 MG TB24 tablet Take 50 mg by mouth daily. 07/26/20   [provider]  ONETOUCH VERIO test strip USE TO CHECK BLOOD SUGARS ONCE DAILY. E11.9 12/12/20   Binnie Rail, MD  phosphorus (K-PHOS-NEUTRAL) [id number] MG tablet Take 500 mg by mouth 2 (two) times daily.  10/25/14   [provider]  predniSONE (DELTASONE) 5 MG tablet Take 1.5 mg by mouth daily.  03/24/12   [provider]  rosuvastatin (CRESTOR) 5 MG tablet Take 5 mg by mouth daily. 07/26/20   [provider]  sodium bicarbonate 650 MG tablet Take 1,300 mg by mouth 3 (three) times daily. 03/16/20   [provider]  sodium chloride 0.9 % SOLN with belatacept 250 MG SOLR Inject into the vein. 02/04/18   [provider]  sulfamethoxazole-trimethoprim (BACTRIM DS) 800-160 MG per tablet Take 1 tablet by mouth daily. 06/15/12   [provider]  tacrolimus (PROGRAF) 1 MG capsule Take 5 mg by mouth 2 (two) times daily.  03/14/15   [provider]    Allergies    Ciprofloxacin and Vicodin [hydrocodone-acetaminophen]  Review of Systems   Review of Systems  Respiratory:  Positive for cough.   Ten systems reviewed and are negative for acute change, except as noted in the HPI.    Physical Exam Updated Vital Signs BP (!) 151/81   Pulse 74   Temp 99.6 F (37.6 C) (Oral)   Resp 18   Ht 5\' 6"  (1.676 m)   Wt 83.5 kg   SpO2 100%   BMI 29.71 kg/m   Physical Exam Vitals and nursing note reviewed.  Constitutional:      General: She is not in acute distress.    Appearance: She is well-developed. She is not diaphoretic.     Comments: Nontoxic appearing and in NAD  HENT:     Head: Normocephalic and atraumatic.     Nose: Congestion present.  Eyes:     General: No scleral icterus.    Conjunctiva/sclera: Conjunctivae normal.  Cardiovascular:     Rate and Rhythm: Normal rate and regular rhythm.     Pulses: Normal pulses.  Pulmonary:     Effort:  Pulmonary effort is normal. No respiratory distress.     Comments: Respirations even and unlabored Musculoskeletal:        General: Normal range of motion.     Cervical back: Normal range of motion.  Skin:    General: Skin is warm and dry.     Coloration: Skin is not pale.     Findings: No erythema or rash.  Neurological:     Mental Status: She is alert and oriented to person, place,  and time.     Coordination: Coordination normal.  Psychiatric:        Behavior: Behavior normal.    ED Results / Procedures / Treatments   Labs (all labs ordered are listed, but only abnormal results are displayed) Labs Reviewed  COMPREHENSIVE METABOLIC PANEL - Abnormal; Notable for the following components:      Result Value   Sodium 134 (*)    CO2 16 (*)    Glucose, Bld 111 (*)    BUN 32 (*)    Creatinine, Ser 3.46 (*)    Total Protein 6.2 (*)    Alkaline Phosphatase 135 (*)    GFR, Estimated 15 (*)    All other components within normal limits  CBC WITH DIFFERENTIAL/PLATELET - Abnormal; Notable for the following components:   RBC 3.77 (*)    Hemoglobin 11.0 (*)    HCT 33.6 (*)    Platelets 143 (*)    All other components within normal limits    EKG EKG Interpretation  Date/Time:  Friday May 17 2021 18:18:59 EDT Ventricular Rate:  78 PR Interval:  156 QRS Duration: 68 QT Interval:  372 QTC Calculation: 424 R Axis:   -11 Text Interpretation: Normal sinus rhythm Minimal voltage criteria for LVH, may be normal variant ( R in aVL ) Borderline ECG Confirmed by Madalyn Rob [phone]) on 05/18/2021 8:16:57 PM  Radiology No results found.  Procedures Procedures   Medications Ordered in ED Medications - No data to display  ED Course  I have reviewed the triage vital signs and the nursing notes.  Pertinent labs & imaging results that were available during my care of the patient were reviewed by me and considered in my medical decision making (see chart for details).  Clinical  Course as of 05/20/21 0120  Fri May 17, 2021  2220 Patient overall well appearing. Not a candidate for Paxlovid due to ESRD/transplant hx, reduced renal function. Her creatinine, per chart review, is at baseline. Will provide information on monoclonal antibody infusion should patient desire this treatment prior to discharge. [KH]  2236 Pt maintained an O2 of 100% on RA, before ambulating. Pt sat up and continued to sat at 100. After instructing the patient to walk around the room for about 5 minutes and stand in place, they maintained an O2 of 98% on RA.  [KH]  Sat May 18, 2021  0129 RN provided information of MAB infusion; however, patient communicated to RN that she does not desire to receive the infusion. Patient requesting discharge to follow up with her PCP. [KH]    Clinical Course User Index [KH] Beverely Pace   MDM Rules/Calculators/A&P                           55 year old female presents to the emergency department for URI symptoms in the setting of COVID positivity.  Had a home positive COVID test yesterday.  Has been feeling more winded with exertion, but has no shortness of breath on assessment and is able to ambulate in the ED without hypoxia.  Chest x-ray findings consistent with known viral illness.  She does not have any tachypnea, dyspnea, hypoxia.  Wells PE score 0.  Did offer monoclonal antibody infusion given history of renal transplant and immunosuppression as well as HIV.  Patient declines infusion.  She is not a candidate for Paxlovid given her renal function.  She is agreeable to follow-up with her primary care doctor.  Return precautions discussed and provided.  Patient discharged in stable condition with no unaddressed concerns.  GAO MITNICK was evaluated in Emergency Department on 05/20/2021 for the symptoms described in the history of present illness. She was evaluated in the context of the global COVID-19 pandemic, which necessitated consideration that the patient  might be at risk for infection with the SARS-CoV-2 virus that causes COVID-19. Institutional protocols and algorithms that pertain to the evaluation of patients at risk for COVID-19 are in a state of rapid change based on information released by regulatory bodies including the CDC and federal and state organizations. These policies and algorithms were followed during the patient's care in the ED.   Final Clinical Impression(s) / ED Diagnoses Final diagnoses:  [id number]    Rx / DC Orders ED Discharge Orders     None        Antonietta Breach, PA-C 05/20/21 0122    Fatima Blank, MD 05/20/21 2003

## 2021-05-18 NOTE — ED Notes (Signed)
Pt provided information on MAB

## 2021-05-18 NOTE — Discharge Instructions (Addendum)
Continue quarantine away from other individuals for 5 days. Take tylenol for fever, headaches, body aches. Drink plenty of fluids to prevent dehydration. You may continue to use other over-the-counter remedies for symptom control, if desired. Return for new or concerning symptoms such as worsening shortness of breath, coughing up blood, persistent vomiting, loss of consciousness.

## 2023-05-22 NOTE — Telephone Encounter (Signed)
Updated MD change.Marland KitchenRaechel Bean
